# Patient Record
Sex: Male | Born: 1935 | Race: Black or African American | Hispanic: No | Smoking: Former smoker
Health system: Southern US, Community
[De-identification: ages and names within clinical notes are randomized; demographics above are authoritative.]

## PROBLEM LIST (undated history)

## (undated) DIAGNOSIS — N2581 Secondary hyperparathyroidism of renal origin: Secondary | ICD-10-CM

## (undated) DIAGNOSIS — E785 Hyperlipidemia, unspecified: Secondary | ICD-10-CM

## (undated) DIAGNOSIS — I10 Essential (primary) hypertension: Secondary | ICD-10-CM

## (undated) DIAGNOSIS — I441 Atrioventricular block, second degree: Secondary | ICD-10-CM

## (undated) DIAGNOSIS — I2510 Atherosclerotic heart disease of native coronary artery without angina pectoris: Secondary | ICD-10-CM

## (undated) DIAGNOSIS — I509 Heart failure, unspecified: Secondary | ICD-10-CM

## (undated) DIAGNOSIS — J189 Pneumonia, unspecified organism: Secondary | ICD-10-CM

## (undated) DIAGNOSIS — R0602 Shortness of breath: Secondary | ICD-10-CM

## (undated) DIAGNOSIS — I7409 Other arterial embolism and thrombosis of abdominal aorta: Secondary | ICD-10-CM

## (undated) DIAGNOSIS — N186 End stage renal disease: Secondary | ICD-10-CM

## (undated) DIAGNOSIS — Z992 Dependence on renal dialysis: Secondary | ICD-10-CM

## (undated) DIAGNOSIS — I219 Acute myocardial infarction, unspecified: Secondary | ICD-10-CM

## (undated) HISTORY — DX: Secondary hyperparathyroidism of renal origin: N25.81

## (undated) HISTORY — DX: Other arterial embolism and thrombosis of abdominal aorta: I74.09

## (undated) HISTORY — DX: Essential (primary) hypertension: I10

## (undated) HISTORY — PX: DIALYSIS FISTULA CREATION: SHX611

## (undated) HISTORY — DX: Atrioventricular block, second degree: I44.1

## (undated) HISTORY — DX: End stage renal disease: N18.6

## (undated) HISTORY — DX: Atherosclerotic heart disease of native coronary artery without angina pectoris: I25.10

## (undated) HISTORY — DX: Heart failure, unspecified: I50.9

## (undated) HISTORY — DX: Hyperlipidemia, unspecified: E78.5

---

## 1997-06-08 DIAGNOSIS — Z992 Dependence on renal dialysis: Secondary | ICD-10-CM

## 1997-06-08 HISTORY — DX: Dependence on renal dialysis: Z99.2

## 1999-03-20 ENCOUNTER — Encounter: Payer: Self-pay | Admitting: Family Medicine

## 1999-03-20 ENCOUNTER — Ambulatory Visit (HOSPITAL_COMMUNITY): Admission: RE | Admit: 1999-03-20 | Discharge: 1999-03-20 | Payer: Self-pay | Admitting: Family Medicine

## 2004-11-12 ENCOUNTER — Ambulatory Visit: Payer: Self-pay | Admitting: Cardiology

## 2005-04-08 ENCOUNTER — Inpatient Hospital Stay (HOSPITAL_COMMUNITY): Admission: AD | Admit: 2005-04-08 | Discharge: 2005-05-05 | Payer: Self-pay | Admitting: Cardiology

## 2005-04-08 ENCOUNTER — Ambulatory Visit: Payer: Self-pay | Admitting: Cardiology

## 2005-04-08 ENCOUNTER — Ambulatory Visit: Payer: Self-pay | Admitting: Critical Care Medicine

## 2005-04-08 ENCOUNTER — Ambulatory Visit: Payer: Self-pay | Admitting: Physical Medicine & Rehabilitation

## 2005-04-10 ENCOUNTER — Ambulatory Visit: Payer: Self-pay | Admitting: Cardiology

## 2005-04-16 ENCOUNTER — Ambulatory Visit: Payer: Self-pay | Admitting: *Deleted

## 2005-04-18 HISTORY — PX: CORONARY ARTERY BYPASS GRAFT: SHX141

## 2005-05-07 ENCOUNTER — Ambulatory Visit: Payer: Self-pay | Admitting: Cardiology

## 2005-05-27 ENCOUNTER — Ambulatory Visit: Payer: Self-pay | Admitting: Cardiology

## 2005-07-09 ENCOUNTER — Ambulatory Visit: Payer: Self-pay | Admitting: Cardiology

## 2005-09-14 ENCOUNTER — Encounter: Payer: Self-pay | Admitting: Cardiology

## 2005-09-15 ENCOUNTER — Ambulatory Visit: Payer: Self-pay | Admitting: Cardiology

## 2005-11-16 ENCOUNTER — Ambulatory Visit: Payer: Self-pay | Admitting: Cardiology

## 2005-11-17 ENCOUNTER — Inpatient Hospital Stay (HOSPITAL_COMMUNITY): Admission: AD | Admit: 2005-11-17 | Discharge: 2005-11-19 | Payer: Self-pay | Admitting: Internal Medicine

## 2005-11-17 ENCOUNTER — Ambulatory Visit: Payer: Self-pay | Admitting: Cardiology

## 2005-11-18 HISTORY — PX: OTHER SURGICAL HISTORY: SHX169

## 2005-12-03 ENCOUNTER — Ambulatory Visit: Payer: Self-pay | Admitting: Cardiology

## 2006-02-25 ENCOUNTER — Ambulatory Visit: Payer: Self-pay | Admitting: Cardiology

## 2006-07-03 IMAGING — CR DG CHEST 1V PORT
1 series · 1 of 1 positions shown · non-contrast
Comparison: 05/01/05.

CLINICAL DATA: History of chest pain.  Follow up chest tube removal.
 PORTABLE CHEST ? 1 VIEW ? 4590 HOURS:

[view not recorded]
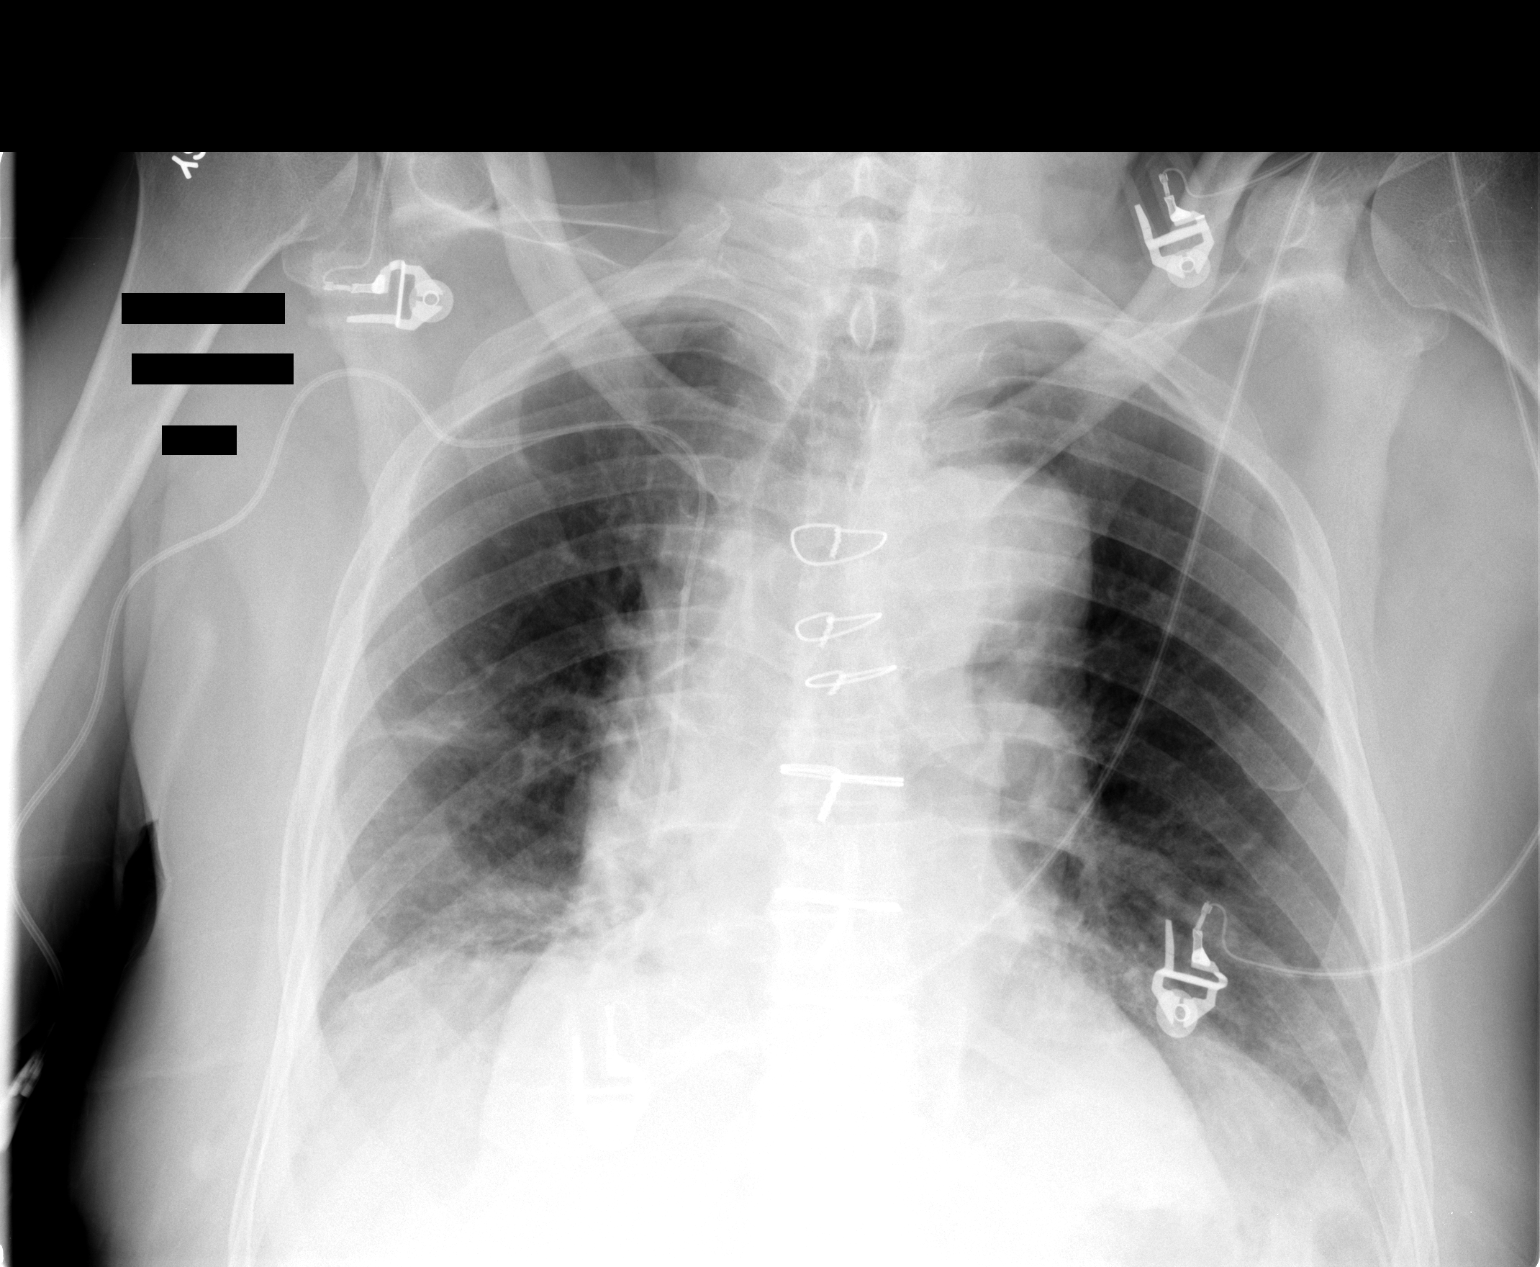

[1 of 1 positions shown; findings below may reference images not displayed]

FINDINGS: A right chest tube has been removed.  There is mild atelectatic change at the right base.  I see no significant pneumothorax.  Central venous catheter tip remains at the SVC/RA junction.  Cardiomegaly again noted.
IMPRESSION: Mild right base atelectasis, no pneumothorax post chest tube removal.

## 2006-07-04 IMAGING — CR DG CHEST 1V PORT
1 series · 1 of 1 positions shown · non-contrast
Comparison: 05/01/05

CLINICAL DATA: CABG
 PORTABLE CHEST- 1 VIEW:

[view not recorded]
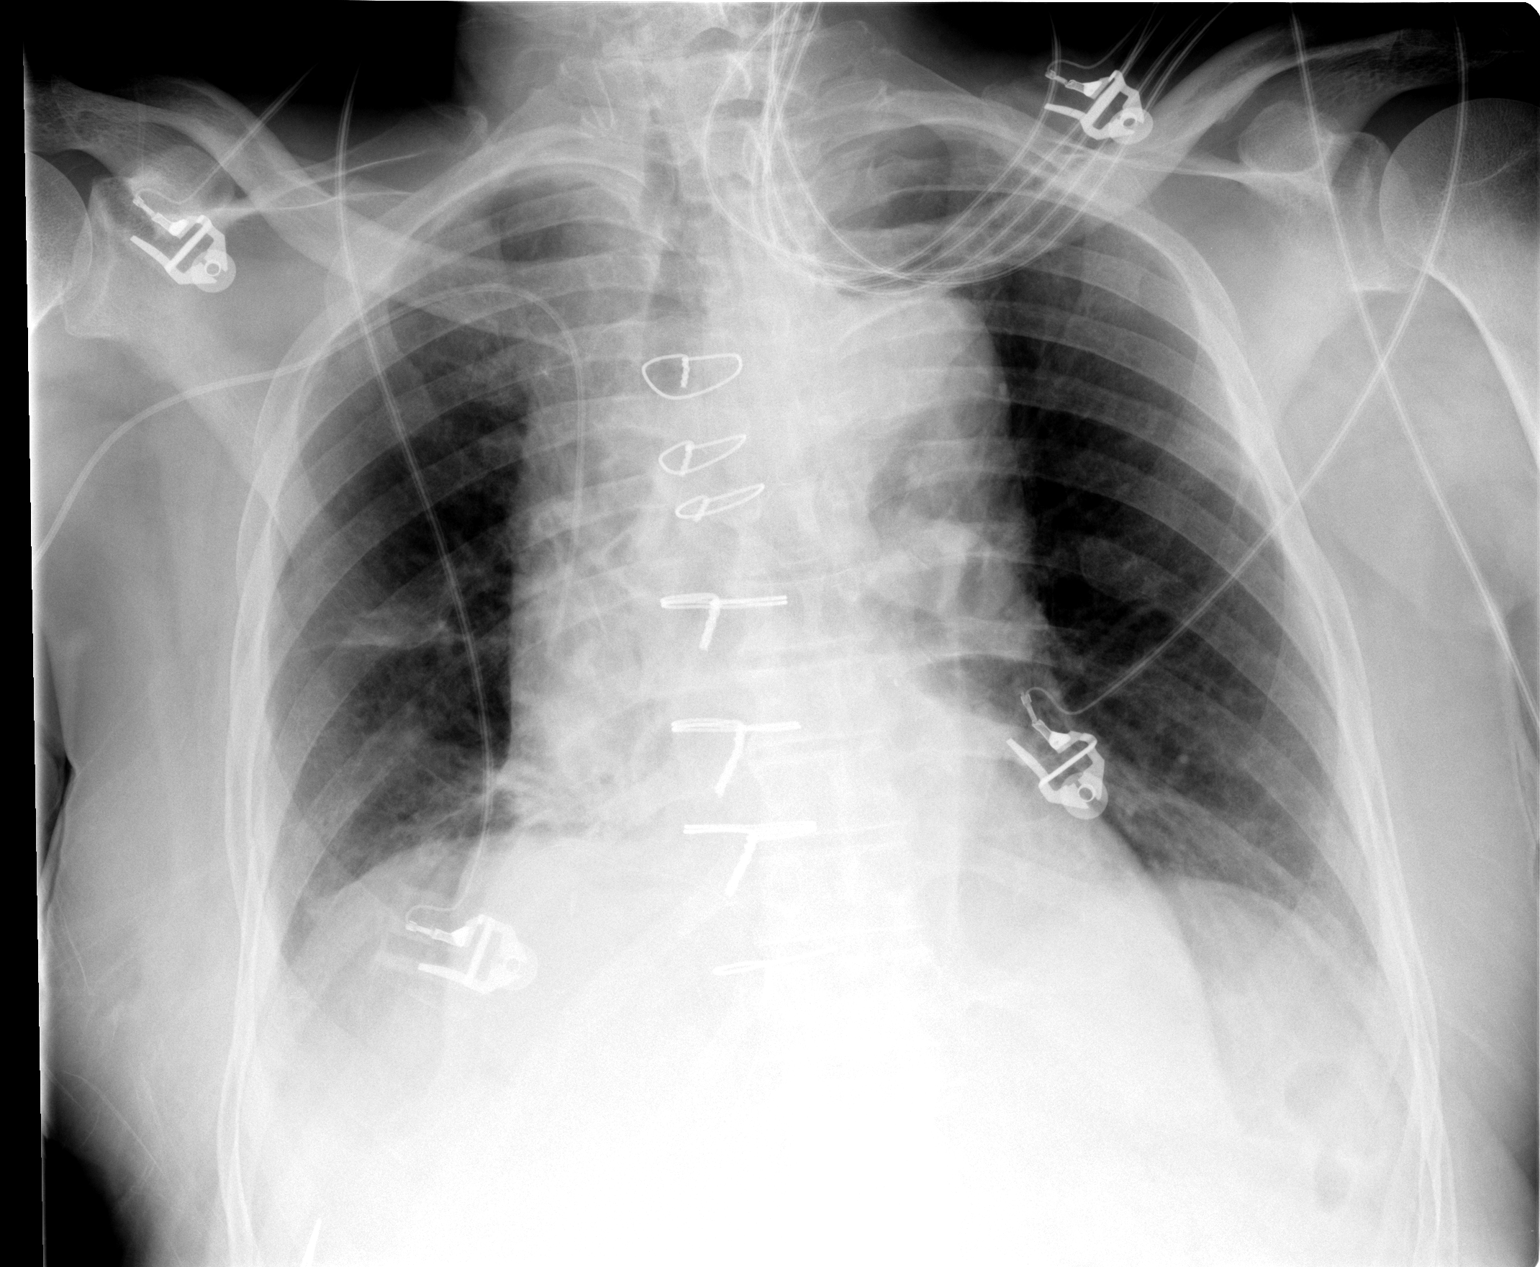

[1 of 1 positions shown; findings below may reference images not displayed]

FINDINGS: Cardiomegaly, cardiac surgery, right PICC line, bilateral lower lung atelectasis are stable.  There is no evidence of pneumothorax.  No significant change since prior study noted.
IMPRESSION: Stable chest.

## 2006-07-07 IMAGING — CR DG HIP COMPLETE 2+V*R*
3 series · 3 of 3 positions shown · non-contrast
Comparison: none

CLINICAL DATA: Chronic hip pain. No acute injury.
 RIGHT HIP ? 3 VIEW:

[t pelvis a.p.]
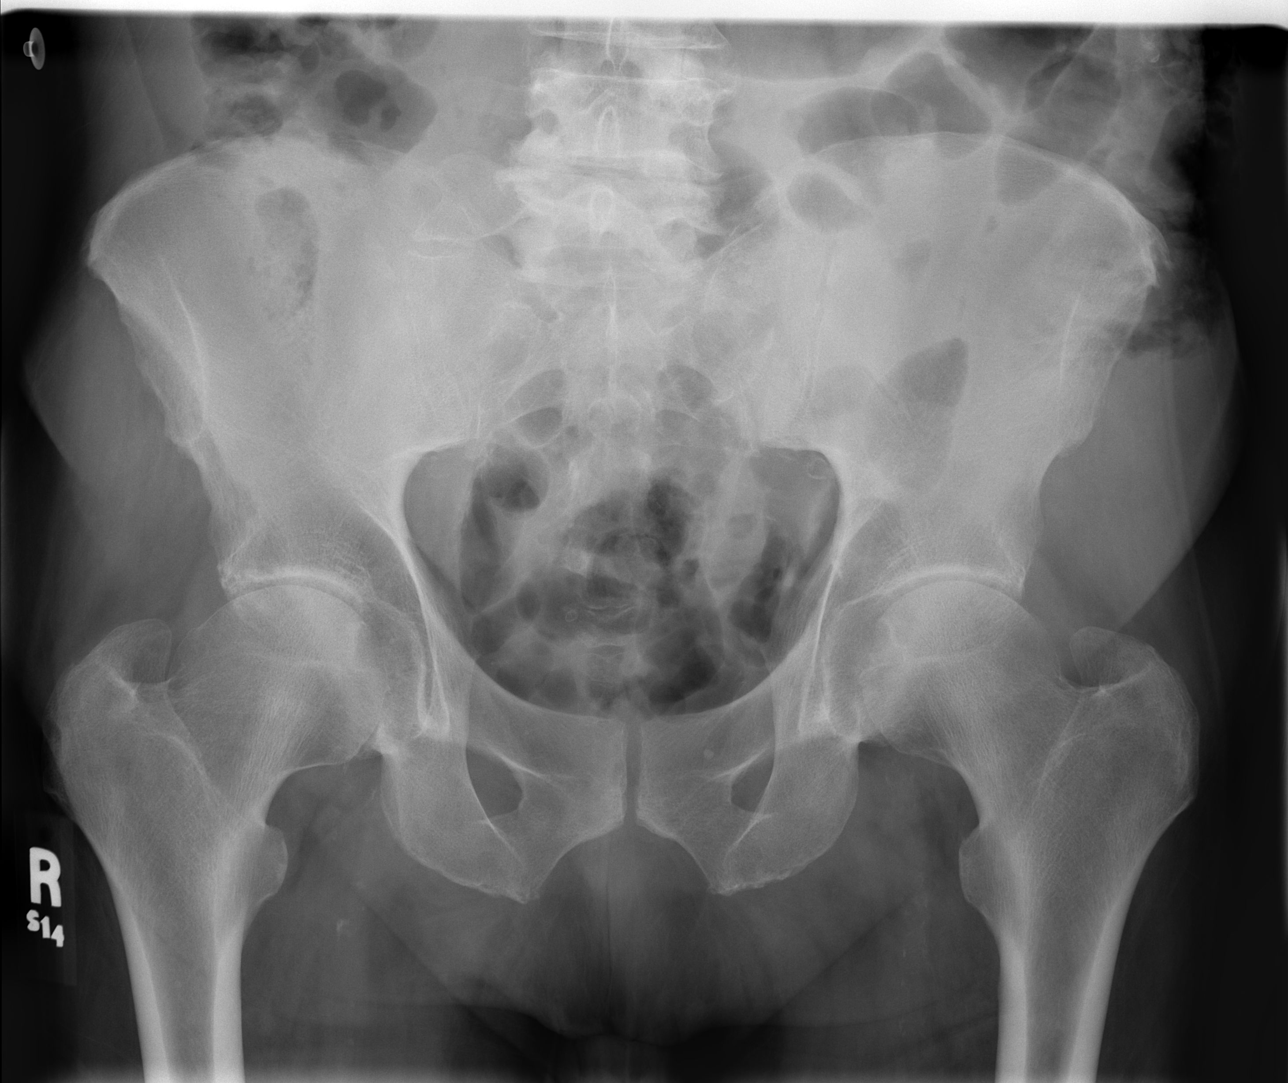

[t hip ap right]
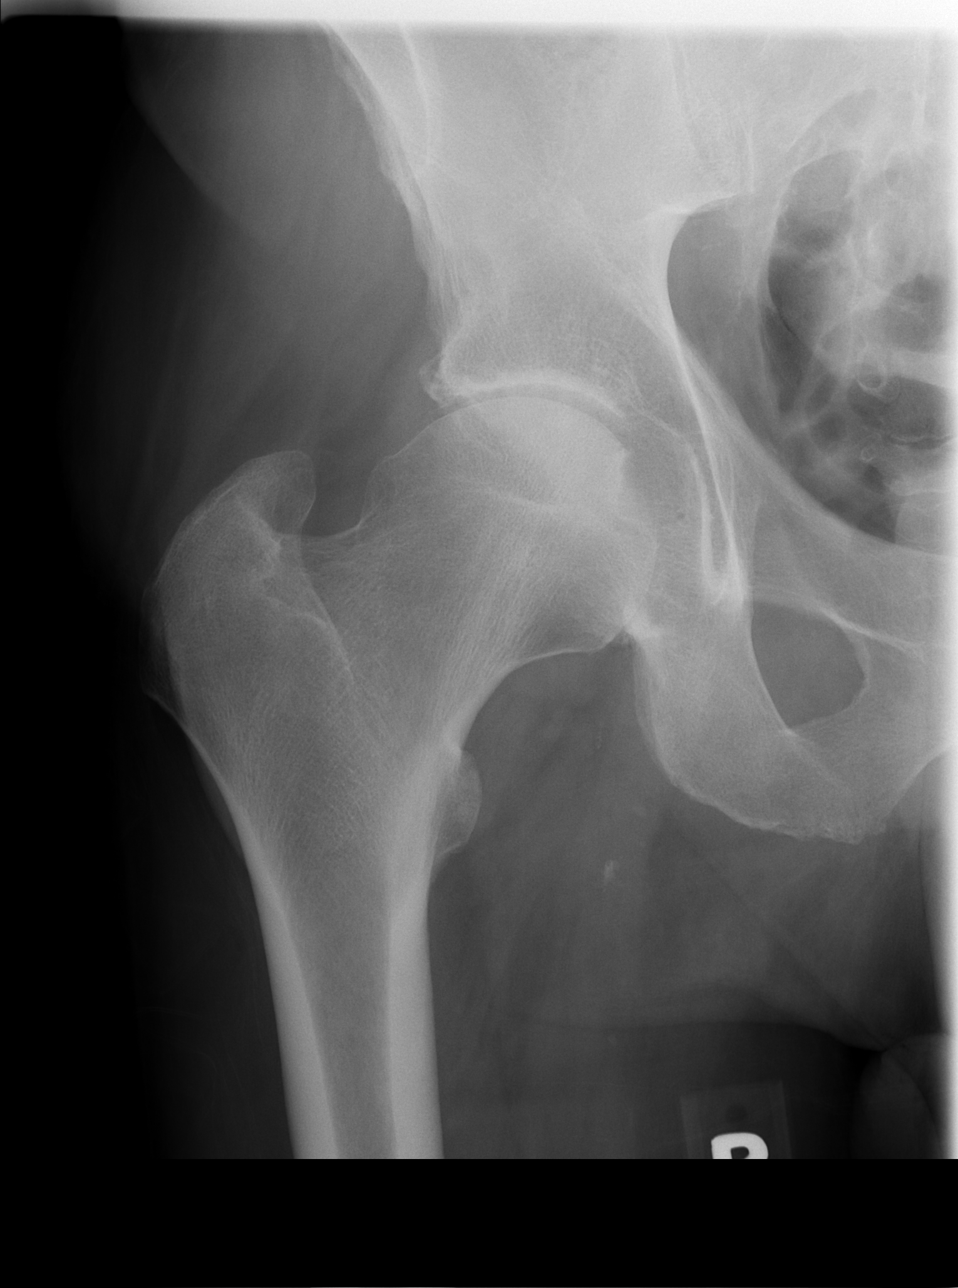

[t hip frog leg right]
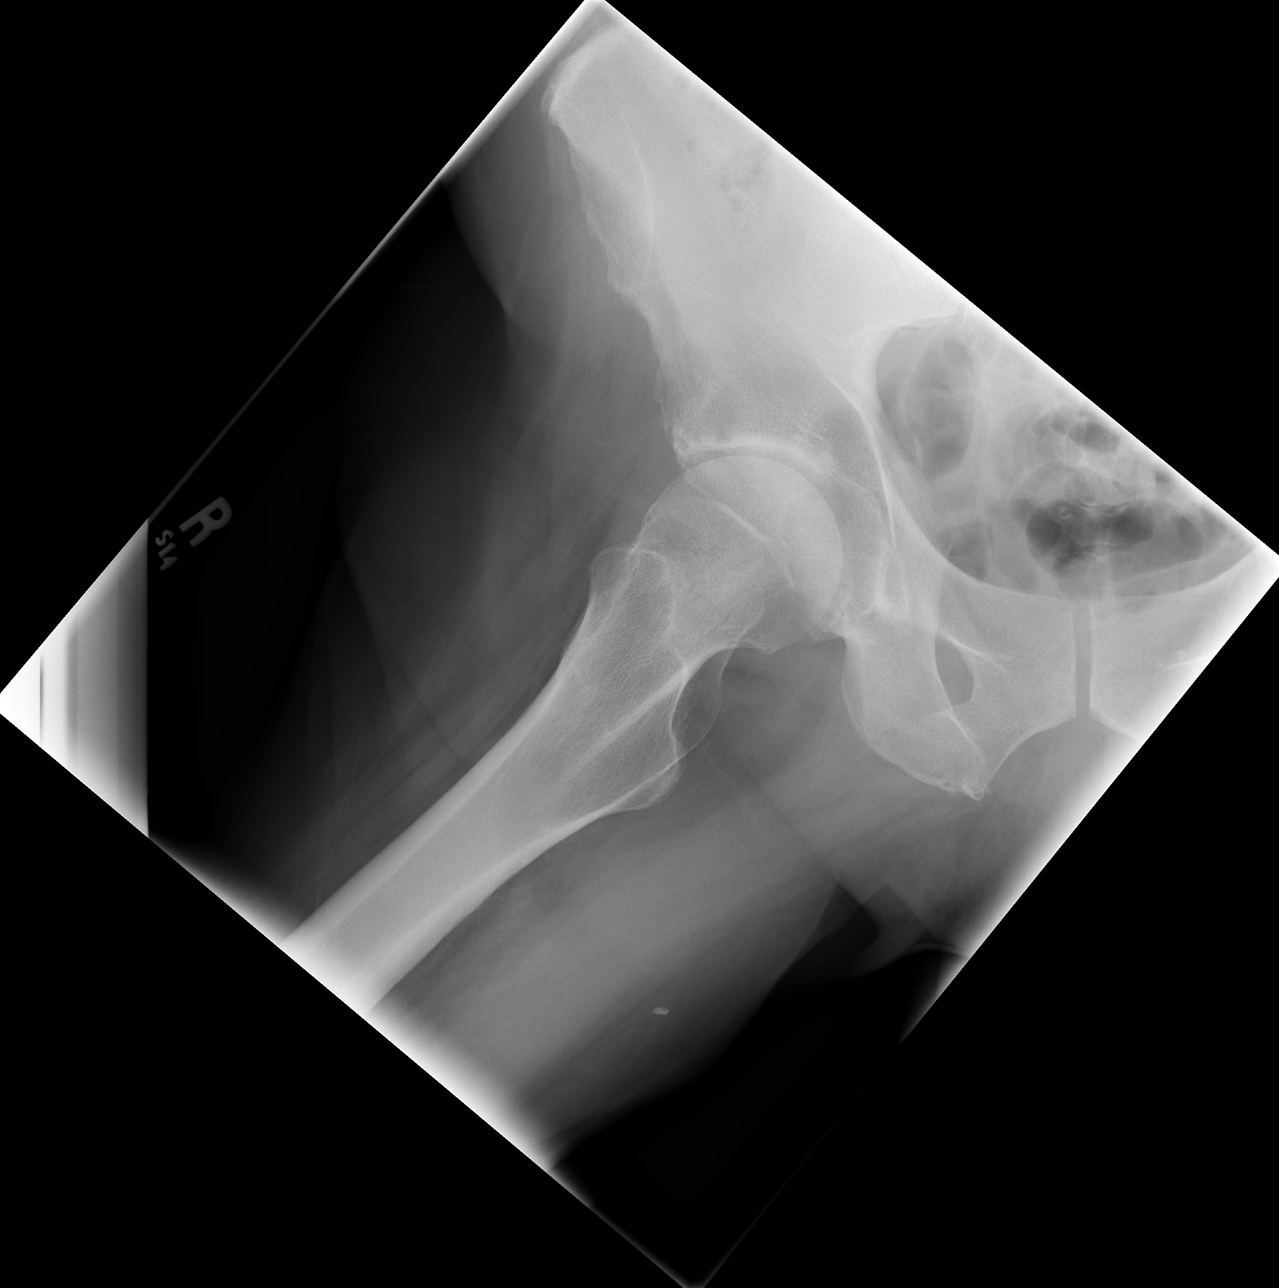

[3 of 3 positions shown; findings below may reference images not displayed]

FINDINGS: There is moderate to marked degenerative disk disease affecting the right hip with subchondral sclerosis and joint space narrowing.
 Mild to moderate degenerative disk disease affects the left hip.
 No acute radiographic abnormalities are noted.  Specifically, I see no fracture or dislocation.
IMPRESSION: 1.  No acute findings.
 2.  Moderate to marked degenerative joint disease affecting the right hip.

## 2007-03-03 ENCOUNTER — Ambulatory Visit: Payer: Self-pay | Admitting: Cardiology

## 2007-04-12 ENCOUNTER — Ambulatory Visit: Payer: Self-pay | Admitting: Internal Medicine

## 2007-08-22 ENCOUNTER — Ambulatory Visit: Payer: Self-pay | Admitting: Internal Medicine

## 2007-12-23 ENCOUNTER — Ambulatory Visit: Payer: Self-pay | Admitting: Internal Medicine

## 2008-04-02 ENCOUNTER — Ambulatory Visit: Payer: Self-pay | Admitting: Internal Medicine

## 2008-06-25 ENCOUNTER — Ambulatory Visit: Payer: Self-pay | Admitting: Internal Medicine

## 2008-09-21 ENCOUNTER — Encounter (INDEPENDENT_AMBULATORY_CARE_PROVIDER_SITE_OTHER): Payer: Self-pay

## 2008-10-04 ENCOUNTER — Encounter: Payer: Self-pay | Admitting: Internal Medicine

## 2008-10-09 ENCOUNTER — Telehealth (INDEPENDENT_AMBULATORY_CARE_PROVIDER_SITE_OTHER): Payer: Self-pay | Admitting: *Deleted

## 2008-11-12 ENCOUNTER — Ambulatory Visit: Payer: Self-pay | Admitting: Internal Medicine

## 2008-11-21 ENCOUNTER — Encounter: Payer: Self-pay | Admitting: Internal Medicine

## 2009-01-29 DIAGNOSIS — E785 Hyperlipidemia, unspecified: Secondary | ICD-10-CM

## 2009-01-29 DIAGNOSIS — I509 Heart failure, unspecified: Secondary | ICD-10-CM | POA: Insufficient documentation

## 2009-01-29 DIAGNOSIS — I10 Essential (primary) hypertension: Secondary | ICD-10-CM | POA: Insufficient documentation

## 2009-02-01 ENCOUNTER — Ambulatory Visit: Payer: Self-pay | Admitting: Internal Medicine

## 2009-02-01 DIAGNOSIS — I359 Nonrheumatic aortic valve disorder, unspecified: Secondary | ICD-10-CM | POA: Insufficient documentation

## 2009-05-16 ENCOUNTER — Encounter: Payer: Self-pay | Admitting: Internal Medicine

## 2009-06-03 ENCOUNTER — Encounter: Payer: Self-pay | Admitting: Internal Medicine

## 2009-07-04 ENCOUNTER — Encounter: Payer: Self-pay | Admitting: Internal Medicine

## 2009-07-14 ENCOUNTER — Encounter: Payer: Self-pay | Admitting: Internal Medicine

## 2009-07-15 ENCOUNTER — Ambulatory Visit: Payer: Self-pay | Admitting: Internal Medicine

## 2009-08-02 ENCOUNTER — Encounter: Payer: Self-pay | Admitting: Internal Medicine

## 2009-10-28 ENCOUNTER — Encounter: Payer: Self-pay | Admitting: Internal Medicine

## 2009-11-13 ENCOUNTER — Ambulatory Visit: Payer: Self-pay | Admitting: Internal Medicine

## 2009-12-19 ENCOUNTER — Encounter: Payer: Self-pay | Admitting: Internal Medicine

## 2010-01-28 ENCOUNTER — Telehealth (INDEPENDENT_AMBULATORY_CARE_PROVIDER_SITE_OTHER): Payer: Self-pay | Admitting: *Deleted

## 2010-02-07 ENCOUNTER — Ambulatory Visit: Payer: Self-pay | Admitting: Internal Medicine

## 2010-05-15 ENCOUNTER — Ambulatory Visit: Payer: Self-pay | Admitting: Internal Medicine

## 2010-05-21 ENCOUNTER — Encounter: Payer: Self-pay | Admitting: Internal Medicine

## 2010-05-24 ENCOUNTER — Encounter: Payer: Self-pay | Admitting: Internal Medicine

## 2010-06-25 ENCOUNTER — Ambulatory Visit: Admit: 2010-06-25 | Payer: Self-pay | Admitting: Internal Medicine

## 2010-06-30 ENCOUNTER — Encounter: Payer: Self-pay | Admitting: Internal Medicine

## 2010-06-30 ENCOUNTER — Ambulatory Visit
Admission: RE | Admit: 2010-06-30 | Discharge: 2010-06-30 | Payer: Self-pay | Source: Home / Self Care | Attending: Internal Medicine | Admitting: Internal Medicine

## 2010-07-10 NOTE — Cardiovascular Report (Signed)
Summary: Office Visit Remote   Office Visit Remote   Imported By: Roderic Ovens 08/02/2009 13:54:55  _____________________________________________________________________  External Attachment:    Type:   Image     Comment:   External Document

## 2010-07-10 NOTE — Letter (Signed)
Summary: Device-Delinquent Phone Journalist, newspaper, Main Office  [address]   [place], [place] [address]   Phone: [phone]  Fax: [phone]     May 21, 2010 MRN: [id number]   [facility] [address] [place], [place]  [address]   Dear Jonathan Padilla,  According to our records, you were scheduled for a device phone transmission on 05-15-2010.     We did not receive any results from this check.  If you transmitted on your scheduled day, please call us to help troubleshoot your system.  If you forgot to send your transmission, please send one upon receipt of this letter.  Thank you,   Architectural technologist Device Clinic

## 2010-07-10 NOTE — Cardiovascular Report (Signed)
Summary: Office Visit Remote   Office Visit Remote   Imported By: Roderic Ovens 12/23/2009 12:21:19  _____________________________________________________________________  External Attachment:    Type:   Image     Comment:   External Document

## 2010-07-10 NOTE — Letter (Signed)
Summary: Device-Delinquent Check  [facility], Main Office  [address]   [place], [place] [address]   Phone: [phone]  Fax: [phone]     July 04, 2009 MRN: [id number]   [facility] [address] [place], [place]  [address]   Dear Mr. Halley,  According to our records, you have not had your implanted device checked in the recommended period of time.  We are unable to determine appropriate device function without checking your device on a regular basis.  Please call our office to schedule an appointment as soon as possible.  If you are having your device checked by another physician, please call us so that we may update our records.  Thank you,   Architectural technologist Device Clinic  Sent via certified mail/AS

## 2010-07-10 NOTE — Cardiovascular Report (Signed)
Summary: Cardiac Catheterization  Cardiac Catheterization   Imported By: Dorise Hiss 02/04/2010 09:24:20  _____________________________________________________________________  External Attachment:    Type:   Image     Comment:   External Document

## 2010-07-10 NOTE — Letter (Signed)
Summary: Remote Device Check  [facility], Main Office  [address]   [place], [place] [address]   Phone: [phone]  Fax: [phone]     December 19, 2009 MRN: [id number]   [facility] [address] [place], [place]  [address]   Dear Mr. Roswell,   Your remote transmission was recieved and reviewed by your physician.  All diagnostics were within normal limits for you.  __X____Your next office visit is scheduled for:  August w/Dr Graciela Husbands. Please call our office to schedule an appointment.    Sincerely,  Vella Kohler

## 2010-07-10 NOTE — Progress Notes (Signed)
Summary: PHONE: APPT   Phone Note Call from Patient Call back at Home Phone [phone]   Caller: Patient Summary of Call: Patient has appt with dr. Johney Frame on 02-07-2010 He takes dialysis on M.W,F so therefore he states that he can not come here on these days.  Who needs to get this message? Initial call taken by: Zachary George,  January 28, 2010 2:59 PM  Follow-up for Phone Call        Pt's 1030 appt r/s to 11am. He will be here as close to 11 as he can. Follow-up by: Cyril Loosen, RN, BSN,  January 28, 2010 4:00 PM

## 2010-07-10 NOTE — Cardiovascular Report (Signed)
Summary: Office Visit   Office Visit   Imported By: Roderic Ovens 02/18/2010 11:53:01  _____________________________________________________________________  External Attachment:    Type:   Image     Comment:   External Document

## 2010-07-10 NOTE — Letter (Signed)
Summary: Device-Delinquent Phone Journalist, newspaper, Main Office  [address]   [place], [place] [address]   Phone: [phone]  Fax: [phone]     Oct 28, 2009 MRN: [id number]   [facility] [address] [place], [place]  [address]   Dear Jonathan Padilla,  According to our records, you were scheduled for a device phone transmission on   10-16-09.     We did not receive any results from this check.  If you transmitted on your scheduled day, please call us to help troubleshoot your system.  If you forgot to send your transmission, please send one upon receipt of this letter.  Thank you,   Architectural technologist Device Clinic

## 2010-07-10 NOTE — Letter (Signed)
Summary: Remote Device Check  [facility], Main Office  [address]   [place], [place] [address]   Phone: [phone]  Fax: [phone]     August 02, 2009 MRN: [id number]   [facility] [address] [place], [place]  [address]   Dear Mr. Azizi,   Your remote transmission was recieved and reviewed by your physician.  All diagnostics were within normal limits for you.  __X___Your next transmission is scheduled for:   Oct 16, 2009.  Please transmit at any time this day.  If you have a wireless device your transmission will be sent automatically.      Sincerely,  Proofreader

## 2010-07-10 NOTE — Assessment & Plan Note (Signed)
Summary: PACER CHECK-PT MAY BE A LITTLE LATE D/T DIALYSIS-JM   Current Medications (verified): 1)  Lipitor 40 Mg Tabs (Atorvastatin Calcium) .... Take One Tablet By Mouth Daily. 2)  Protonix 20 Mg Tbec (Pantoprazole Sodium) .... Take 1 Tablet By Mouth Once Daily 3)  Renagel 800 Mg Tabs (Sevelamer Hcl) .... Four Times A Day 4)  Cozaar 100 Mg Tabs (Losartan Potassium) .... Take One Tablet By Mouth Daily 5)  Carvedilol 6.25 Mg Tabs (Carvedilol) .... Take One Tablet By Mouth Twice A Day 6)  Isosorbide Mononitrate Cr 30 Mg Xr24h-Tab (Isosorbide Mononitrate) .... Take One Tablet By Mouth Daily 7)  Amlodipine Besylate 10 Mg Tabs (Amlodipine Besylate) .... Take One Tablet By Mouth Daily 8)  Rena-Vite  Tabs (B Complex-C-Folic Acid) .... Take 1 Tablet By Mouth Once Daily  Allergies (verified): No Known Drug Allergies   PPM Specifications Following MD:  Sherryl Manges, MD     PPM Vendor:  Medtronic     PPM Model Number:  [id number]     PPM Serial Number:  [id number] PPM DOI:  11/18/2005     PPM Implanting MD:  Sherryl Manges, MD  Lead 1    Location: RA     DOI: 11/18/2005     Model #: [id number]     Serial #: [id number]     Status: active Lead 2    Location: RV     DOI: 11/18/2005     Model #: [id number]     Serial #: [id number]     Status: active   Indications:  CHB   PPM Follow Up Battery Voltage:  3.00 V     Pacer Dependent:  No       PPM Device Measurements Atrium  Amplitude: 2.1 mV, Impedance: 496 ohms, Threshold: 0.5 V at 0.4 msec Right Ventricle  Amplitude: 5.1 mV, Impedance: 480 ohms, Threshold: 1.0 V at 0.4 msec  Episodes MS Episodes:  0     Coumadin:  No Ventricular High Rate:  0     Atrial Pacing:  5.8%     Ventricular Pacing:  15.6  Parameters Mode:  DDDR     Lower Rate Limit:  60     Upper Rate Limit:  130 Paced AV Delay:  350     Sensed AV Delay:  350 Next Remote Date:  05/15/2010     Next Cardiology Appt Due:  08/07/2010 Tech Comments:  NORMAL DEVICE FUNCTION.  NO EPISODES SINCE LAST  CHECK.  NO CHANGES MADE. CARELINK CHECK 05-15-10. ROV IN 6 MTHS W/JA IN [place]. Vella Kohler  February 07, 2010 10:40 AM

## 2010-07-10 NOTE — Cardiovascular Report (Signed)
Summary: Office Visit Remote   Office Visit Remote   Imported By: Roderic Ovens 06/17/2010 13:41:18  _____________________________________________________________________  External Attachment:    Type:   Image     Comment:   External Document

## 2010-07-10 NOTE — Assessment & Plan Note (Signed)
Summary: followup from carelink/vt episode   Visit Type:  Pacemaker check Primary Provider:  Dr Christell Constant   History of Present Illness: The patient presents today for routine electrophysiology followup. He reports doing very well since last being seen in our clinic.  He is chronically on hemodialysis.  The patient denies symptoms of palpitations, chest pain, shortness of breath, orthopnea, PND, , dizziness, presyncope, syncope, or neurologic sequela.  His edema is stable and managed with dialysis.  The patient is tolerating medications without difficulties and is otherwise without complaint today.   Preventive Screening-Counseling & Management  Alcohol-Tobacco     Smoking Status: quit  Current Medications (verified): 1)  Lipitor 40 Mg Tabs (Atorvastatin Calcium) .... Take One Tablet By Mouth Daily. 2)  Protonix 20 Mg Tbec (Pantoprazole Sodium) .... Take 1 Tablet By Mouth Once Daily 3)  Renagel 800 Mg Tabs (Sevelamer Hcl) .... Four Times A Day 4)  Cozaar 100 Mg Tabs (Losartan Potassium) .... Take One Tablet By Mouth Daily 5)  Carvedilol 6.25 Mg Tabs (Carvedilol) .... Take One Tablet By Mouth Twice A Day 6)  Isosorbide Mononitrate Cr 30 Mg Xr24h-Tab (Isosorbide Mononitrate) .... Take One Tablet By Mouth Daily 7)  Amlodipine Besylate 10 Mg Tabs (Amlodipine Besylate) .... Take One Tablet By Mouth Daily 8)  Rena-Vite  Tabs (B Complex-C-Folic Acid) .... Take 1 Tablet By Mouth Once Daily  Allergies (verified): No Known Drug Allergies  Comments:  Nurse/Medical Assistant: The patient's medications and allergies were reviewed with the patient and were updated in the Medication and Allergy Lists. Tammi Romine CMA (June 30, 2010 2:58 PM)  Past History:  Past Medical History: HYPERLIPIDEMIA-MIXED (ICD-272.4) HYPERTENSION, UNSPECIFIED (ICD-401.9) CONGESTIVE HEART FAILURE, UNSPECIFIED (ICD-428.0) CAD, ARTERY BYPASS GRAFT (ICD-414.04) AV BLOCK, COMPLETE (intermittent) s/p PPM End-stage renal  disease on HD Aortoiliac occlusive disease Secondary hyperparathyroidism Gout.  Past Surgical History: Reviewed history from 01/29/2009 and no changes required. CABG - 04/18/05 - Sheliah Plane, MD PCM - 11/18/05 - Medtronic EnRhythm W1191 - complete heart block  Social History: Reviewed history from 01/29/2009 and no changes required. Retired  Married  Tobacco Use - Former.   Review of Systems       All systems are reviewed and negative except as listed in the HPI.   Vital Signs:  Patient profile:   77 year old male Height:      76 inches Weight:      173 pounds BMI:     21.13 Pulse rate:   75 / minute  Serial Vital Signs/Assessments:  Comments: TOOK BP IN R LOWER LEG: 78/48 By: Fuller Plan CMA    Physical Exam  General:  elderly male, NAD Head:  normocephalic and atraumatic Eyes:  PERRLA/EOM intact; conjunctiva and lids normal. Mouth:  Teeth, gums and palate normal. Oral mucosa normal. Neck:  supple Chest Wall:  R sided PPM pocket is well healed Lungs:  clear Heart:  RRR, 2/6 diastolic murmur LSB Abdomen:  Bowel sounds positive; abdomen soft and non-tender without masses, organomegaly, or hernias noted. No hepatosplenomegaly. Msk:  Back normal, normal gait. Muscle strength and tone normal. Extremities:  R arm AV graft in place L arm AV fistula with multiple pseudoaneurysms Neurologic:  Alert and oriented x 3. Skin:  Intact without lesions or rashes. Psych:  Normal affect.   PPM Specifications Following MD:  Sherryl Manges, MD     PPM Vendor:  Medtronic     PPM Model Number:  [id number]     PPM Serial Number:  [id number]  PPM DOI:  11/18/2005     PPM Implanting MD:  Sherryl Manges, MD  Lead 1    Location: RA     DOI: 11/18/2005     Model #: 0454     Serial #: [id number]     Status: active Lead 2    Location: RV     DOI: 11/18/2005     Model #: 2956     Serial #: [id number]     Status: active   Indications:  CHB   PPM Follow Up Battery Voltage:  2.99 V      Pacer Dependent:  No       PPM Device Measurements Atrium  Amplitude: 2.1 mV, Impedance: 400 ohms, Threshold: 0.5 V at 0.4 msec Right Ventricle  Amplitude: 11.3 mV, Impedance: 480 ohms, Threshold: 1.0 V at 0.4 msec  Episodes MS Episodes:  0     Coumadin:  No Ventricular High Rate:  4     Atrial Pacing:  5.2%     Ventricular Pacing:  32.4%  Parameters Mode:  DDDR     Lower Rate Limit:  60     Upper Rate Limit:  130 Paced AV Delay:  350     Sensed AV Delay:  350 Next Remote Date:  10/02/2010     Next Cardiology Appt Due:  06/09/2011 Tech Comments:  4 NST EPISODES--LONGEST WAS 6 BEATS. NORMAL DEVICE FUNCTION.  NO CHANGES MADE. CARELINK 10-02-10 AND ROV IN 12 MTHS W/JA IN [place] [facility]. Vella Kohler  June 30, 2010 3:04 PM MD Comments:  agree  Impression & Recommendations:  Problem # 1:  AV BLOCK, COMPLETE INTERMITTENT (ICD-426.0) normal ppm function as above  Problem # 2:  CAD, ARTERY BYPASS GRAFT EF NL (ICD-414.04) no symptoms of ischemia he has been taken off of ASA due to bleeding  Problem # 3:  HYPERTENSION, UNSPECIFIED (ICD-401.9) stable  Problem # 4:  HYPERLIPIDEMIA-MIXED (ICD-272.4) stable His updated medication list for this problem includes:    Lipitor 40 Mg Tabs (Atorvastatin calcium) .Marland Kitchen... Take one tablet by mouth daily.

## 2010-07-16 NOTE — Cardiovascular Report (Signed)
Summary: Card Device Clinic/ INTERROGATION QUICK LOOK  Card Device Clinic/ INTERROGATION QUICK LOOK   Imported By: Dorise Hiss 07/10/2010 16:44:41  _____________________________________________________________________  External Attachment:    Type:   Image     Comment:   External Document

## 2010-10-02 ENCOUNTER — Encounter: Payer: Self-pay | Admitting: *Deleted

## 2010-10-05 ENCOUNTER — Encounter: Payer: Self-pay | Admitting: *Deleted

## 2010-10-21 NOTE — Cardiovascular Report (Signed)
[facility]                   [facility] NOTE   NAME:Honsinger, Rebecka Apley                       MRN:          [id number]  DATE:03/03/2007                            DOB:          [birth year]    Mr. Kreitzer was seen today in the [facility] for followup of his  Medtronic InRhythm pacemaker, model #[id number].  His device was implanted  on November 18, 2005, for complete heart block.   Interrogation of his device demonstrates,  P waves of 2.1 millivolts with an atrial impedance of 552 ohms and a  threshold of 1 volt at 0.4 milliseconds.  His R waves measured 11 millivolts with an RV impedance of 512 ohms and  a threshold of 0.5 volts at 0.4 milliseconds.  His battery voltage was 3 volts.  He was in sinus bradycardia today.  He had no mode switch or ventricular heart rate episodes since last  interrogation.  He is programmed DDDR with lower of 60 and upper rate 130, with a paced  AV of 250 milliseconds and a sensed AV of 220 msec.  He is currently A pacing 4.5% of the time, V pacing 98.7% of the time.  Today, I changed his AV delay to a paced AV of 300 milliseconds with a  sensed AV of 280 milliseconds to allow for intrinsic induction.   He will send [facility] transmissions and return to the clinic in 1 year.      Gypsy Balsam, RN,BSN  Electronically Signed      Duke Salvia, MD, [facility]  Electronically Signed   AS/MedQ  DD: 03/03/2007  DT: 03/03/2007  Job #: [id number]

## 2010-10-24 NOTE — Op Note (Signed)
NAMEKAID, SEEBERGER NO.:  [id number]   MEDICAL RECORD NO.:  [id number]          PATIENT TYPE:  INP   LOCATION:  2310                         FACILITY:  [facility]   PHYSICIAN:  Shan Levans, M.D. LHCDATE OF BIRTH:  05-31-1928   DATE OF PROCEDURE:  04/17/2005  DATE OF DISCHARGE:                                 OPERATIVE REPORT   PROCEDURE PERFORMED:  Bronchoscopy.   PULMONOLOGIST:  Shan Levans, M.D.   ANESTHESIA:  One percent Xylocaine local.   PREPROCEDURE MEDICATION:   INDICATIONS:  Evaluate for mucous plug.   Doctor cancelled dictation at this point.      Shan Levans, M.D. [facility]  Electronically Signed     PW/MEDQ  D:  04/17/2005  T:  04/18/2005  Job:  [id number]   cc:   Kerin Perna, M.D.  [address]  [place]  [place] [address]

## 2010-10-24 NOTE — Discharge Summary (Signed)
Jonathan, Padilla NO.:  [id number]   MEDICAL RECORD NO.:  [id number]          PATIENT TYPE:  INP   LOCATION:  2901                         FACILITY:  [facility]   PHYSICIAN:  Arvilla Meres, M.D. LHCDATE OF BIRTH:  [birth year]   DATE OF ADMISSION:  11/17/2005  DATE OF DISCHARGE:  11/18/2005                                 DISCHARGE SUMMARY   .   Dictation stopped here.      Maple Mirza, P.A.      Arvilla Meres, M.D. [facility]  Electronically Signed    GM/MEDQ  D:  11/18/2005  T:  11/18/2005  Job:  [id number]   cc:   Paulita Cradle, NP  [facility]   Duke Salvia, M.D.  [address]  [address]  [place]  [place] [address]   Learta Codding, M.D. [facility]  [address]  [address]  [place]  [place] [address]   Hemodialysis Center in [place] [phone]

## 2010-10-24 NOTE — Cardiovascular Report (Signed)
NAMEELIS, SAUBER NO.:  [id number]   MEDICAL RECORD NO.:  [id number]          PATIENT TYPE:  INP   LOCATION:  4743                         FACILITY:  [facility]   PHYSICIAN:  Arvilla Meres, M.D. LHCDATE OF BIRTH:  12/23/1927   DATE OF PROCEDURE:  04/09/2005  DATE OF DISCHARGE:                              CARDIAC CATHETERIZATION   PATIENT IDENTIFICATION:  Jonathan Padilla is a 77 year old male with a history of  a peripheral vascular disease and end-stage renal disease, been on dialysis  for several years.  Was recently admitted with congestive heart failure and  found to have a mildly reduced LV function.  Also had some a atypical chest  pain which resolved with a GI cocktail.  Troponin was mildly positive at  0.47 and he is thus referred for cardiac catheterization.   PROCEDURES PERFORMED:  1.  Selective coronary angiography.  2.  Left heart catheterization.  3.  Left ventriculogram.  4.  Left subclavian angiography.   DESCRIPTION OF PROCEDURE:  The risks and benefits of catheterization were  explained to Mr. Herder.  Consent was signed and placed on the chart.  A 6-  French sheath was placed in the right femoral artery.  Standard JL5, JR4,  and angled pigtail were used for the procedure.  There were no apparent  complications.  All catheter exchanges were made over a wire.   Central aortic pressure is 169/92 with a mean of 123.  LV pressure is 196/15  with an EDP of 15.  There was no gradient on aortic valve pullback.   Left main had a 30% distal lesion.   LAD was a long vessel wrapping the apex.  It gave off a tiny first diagonal  and a large second diagonal.  There was a 50-60% concentric plaque in the  ostium of the LAD.  In the mid LAD there was heavy disease.  There was a  long 70-80% lesion culminating in a 90% stenosis prior to the takeoff the  second diagonal.  After the second diagonal there was a long 50% lesion in  the mid section.  The distal  LAD was large and appeared suitable for a  graft.   Left circumflex was a large ectatic vessel.  It gave off a tiny ramus, a  small branching OM1, a tiny OM2, and a moderate OM3.  Circumflex was also  heavily diseased.  There was a 60% ostial lesion followed by an 80% proximal  lesion and 60% tubular mid lesion.  In the proximal portion of the OM1 there  was a 99% stenosis followed by a 60% stenosis in the body.  In the OM3 there  was a 99% stenosis.   The right coronary artery was heavily calcified proximally.  There was an 80-  90% stenosis proximally.  In the mid section there was an 80% stenosis.  Distally around the bend there was 30% tubular stenosis followed by a 40%  stenosis prior to the takeoff of the PDA.  PDA was a small to moderate-sized  vessel.  There was two small PLs.   Left  ventriculogram done in the RAO approach showed an EF of 45-50% with no  significant mitral regurgitation.  There was a mild anterior wall  hypokinesis.   Left subclavian angiography showed a patent subclavian with a patent LIMA to  the chest wall.  There was a 95% stenosis in the ostium of the vertebral  branch.   ASSESSMENT:  1.  Severe three vessel coronary artery disease.  2.  Mild decreased in left ventricular function with elevated filling      pressures.  3.  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  4.  Patent subclavian with no significant obstruction.  The left internal      mammary artery is patent to the chest wall.   PLAN:  1.  We will consult CVTS for possible bypass.  Although he would be at      increased risk because comorbidities.  2.  He will need hemodialysis today.  3.  Continue risk factor modification.      Arvilla Meres, M.D. [facility]  Electronically Signed     DB/MEDQ  D:  04/09/2005  T:  04/09/2005  Job:  [id number]

## 2010-10-24 NOTE — Discharge Summary (Signed)
NAMEJDEN, WANT NO.:  [id number]   MEDICAL RECORD NO.:  [id number]          PATIENT TYPE:  INP   LOCATION:  2023                         FACILITY:  [facility]   PHYSICIAN:  Coral Ceo, P.A.     DATE OF BIRTH:  23-May-1928   DATE OF ADMISSION:  04/08/2005  DATE OF DISCHARGE:                                 DISCHARGE SUMMARY   Audio too short to transcribe (less than 5 seconds)      Coral Ceo, P.A.     GC/MEDQ  D:  05/05/2005  T:  05/05/2005  Job:  [id number]

## 2010-10-24 NOTE — Discharge Summary (Signed)
Jonathan Padilla, Jonathan Padilla NO.:  [id number]   MEDICAL RECORD NO.:  [id number]          PATIENT TYPE:  INP   LOCATION:  2901                         FACILITY:  [facility]   PHYSICIAN:  Arvilla Meres, M.D. [facility] OF BIRTH:  10/31/1927   DATE OF ADMISSION:  11/17/2005  DATE OF DISCHARGE:                                 DISCHARGE SUMMARY   He has no known drug allergies.   PRINCIPAL DIAGNOSES:  1.  Admitted with fatigue for a 24-hour period.      1.  Third degree AV block noted in the emergency department.      2.  Concurrent potassium of 6.4.      3.  AV dissociation continues even after potassium normalized by          Kayexalate and hemodialysis on November 16, 2005.  2.  Discharging back to [facility], November 18, 2005, status post      implantation of Medtronic EnRhythm dual chamber pacemaker by Dr. Sherryl Manges.   SECONDARY DIAGNOSES:  1.  History of myocardial infarction with subsequent coronary artery bypass      graft surgery for 3-vessel coronary artery disease, November 2006.  2.  Congestive heart failure symptoms, class IIB.  3.  End-stage renal disease, hemodialysis Monday, Wednesday, Friday.  4.  Chronic obstructive pulmonary disease, history of tobacco habituation,      stopped smoking November 2006.  5.  Hypertension.  6.  Aortoiliac occlusive disease, status post resection and grafting of      abdominal aortic aneurysm.  7.  Dyslipidemia.  8.  Secondary hyperparathyroidism.  9.  Post coronary artery bypass graft surgery atrial fibrillation on      amiodarone.  10. Gout.   PROCEDURE:  November 18, 2005, implantation of Medtronic EnRhythm dual chamber  pacemaker by Dr. Sherryl Manges.  The patient is A sensing and V pacing at a  rate of 75.  He has had no post procedural complications.  Following  implantation, he will have hemodialysis here at [facility] without  heparin.   BRIEF HISTORY:  Jonathan Padilla is a 77 year old male.  He has a  history of  coronary artery disease, aortoiliac occlusive disease, end-stage renal  disease, COPD, hypertension, dyslipidemia, congestive heart failure, and  presents with WEARYING FATIGUE to [facility] after 36 hours at home.  He had a myocardial infarction in November 2006, with subsequent coronary  artery bypass graft surgery.  He has had resection and grafting of abdominal  aortic aneurysm at [facility].  He is on hemodialysis Monday,  Wednesday, Friday for the last 2-3 years.  He had atrial fibrillation post  coronary artery bypass graft surgery and is on amiodarone.  This dose was  possibly to be discontinued if the patient maintained a sinus rhythm.   On arrival at [facility], he was found to be in third degree AV  block.  His heart rate was 35 beats per minute.  His potassium was 6.4.  He  received Kayexalate and underwent hemodialysis on November 16, 2005.  It  is  thought that elevated potassium is secondary to dietary noncompliance and  Altace which was recently increased from 5 mg to 10 mg daily.  On the  morning of November 17, 2005, the potassium was 4.5.  Of note, the patient is on  both amiodarone and Coreg, both are on hold.  He received a transfer from  [facility] for implantation of permanent pacemaker.   HOSPITAL COURSE:  The patient presents stable with third degree heart block  from [facility] on November 17, 2005.  Laboratory studies were obtained  and hemodialysis arrangements were made for Wednesday, November 18, 2005.  The  patient has not had any respiratory distress or chest pain, only mild  dyspnea and most markedly fatigue.  He was seen by Dr. Sherryl Manges who  recommended implantation of permanent pacemaker and this was done on November 18, 2005 in the morning.  The patient has had no post procedural  complications.  There is no evidence of hematoma at the pacemaker insertion  site.  The patient is A sensing and V pacing.  He will have  hemodialysis  prior to transfer back to [facility].   It is recommended that not get the incision wet for the next 7 days and he  is to sponge bathe until Wednesday, November 25, 2005.  He is to continue  hemodialysis Monday, Wednesday, Friday.  He is asked not to lift anything  heavier than 10 pounds for the next 4 weeks.  Mobility instructions of the  right arm have been given to the patient prior to discharge.   He will discharge on the following medications:  1.  Altace 5 mg daily.  2.  Coreg 6.25 mg twice daily.  3.  Amiodarone 200 mg daily, possibly to discontinue if he maintains sinus      rhythm.  4.  Protonix 40 mg daily.  5.  Lipitor 40 mg daily at bedtime.  6.  Renagel 800 mg, 3 tablets before each meal and 1 tablet with snacks.  7.  Fibercon 2 tablets daily.  8.  Nitroglycerin 0.4 mg, 1 tab under the tongue every 5 minutes x3 doses as      needed for chest pain.  9.  Oxycodone 5 mg, 1-2 tabs every 4-6 hours as needed.  10. Advair 1-2 puffs daily.   He has followup at [facility], the [facility] office.  1.  To see Dr. Andee Lineman for an incision check and possible medicine      adjustment.  Thursday, June 28th at 2:15 p.m.  2.  He will present to the [facility] office at [facility], Thursday,      September 20th at 9:30 to have pacer check/reprogramming.   LABORATORY STUDIES THIS ADMISSION:  PT was 14.3, INR 1.1.  Complete blood  count, November 17, 2005, white cells 8.2, hemoglobin 12.2, hematocrit 37.5, and  platelets 273.  The BNP on November 17, 2005 was 421.  Serum  electrolytes 132 is the sodium, potassium 3.9, chloride 91, carbonate 27,  BUN is 43, creatinine 9.4, and glucose 139.  Electrocardiogram shows AV  dissociation with heart rate of 56 on admission to [facility] on  November 17, 2005.      Maple Mirza, P.A.      Arvilla Meres, M.D. [facility]  Electronically Signed    GM/MEDQ  D:  11/18/2005  T:  11/18/2005  Job:  [id number]  cc:   Paulita Cradle, NT  [facility]   Duke Salvia, M.D.  [address]  [address]  [place]  [place] [address]   Learta Codding, M.D. [facility]  [address]  [address]  [place]  [place] [address]   [facility] [place], [place]  Fax:  [phone]   patient to take on discharge  Room 2901  [facility]

## 2010-10-24 NOTE — Op Note (Signed)
[name], Jonathan Padilla NO.:  [id number]   MEDICAL RECORD NO.:  [id number]          PATIENT TYPE:  INP   LOCATION:  2310                         FACILITY:  [facility]   PHYSICIAN:  Shan Levans, M.D. LHCDATE OF BIRTH:  11-17-1927   DATE OF PROCEDURE:  04/17/2005  DATE OF DISCHARGE:                                 OPERATIVE REPORT   PROCEDURE PERFORMED:  Bronchoscopy.   PULMONOLOGIST:  Shan Levans, M.D.   ANESTHESIA:  One percent Xylocaine local.   PREPROCEDURE MEDICATIONS:  Versed 4 mg and fentanyl 100 mcg IV push.   INDICATIONS:  Right lung collapse.   DESCRIPTION OF PROCEDURE:  The bedside Olympus bronchoscope was introduced  via the oropharynx.  The upper airway was visualized.  This showed a severe  upper airway edema.  Attention was then directed to the lower airway.  Severe mucous plugging was seen with total occlusion of the right mainstem  bronchus with mucous extending up into the trachea.  This was suctioned  free.  Extensive tracheobronchitis was encountered.   The patient tolerated the procedure fairly poorly and is on bilevel support  at the end of the procedure; may yet require intubation.      Shan Levans, M.D. [facility]  Electronically Signed     PW/MEDQ  D:  04/17/2005  T:  04/18/2005  Job:  [id number]

## 2010-10-24 NOTE — Consult Note (Signed)
NAMEWAYLIN, Padilla NO.:  [id number]   MEDICAL RECORD NO.:  [id number]          PATIENT TYPE:  INP   LOCATION:  2901                         FACILITY:  [facility]   PHYSICIAN:  Duke Salvia, M.D.  DATE OF BIRTH:  December 16, 1927   DATE OF CONSULTATION:  11/18/2005  DATE OF DISCHARGE:                                   CONSULTATION   REASON FOR CONSULTATION:  Thank you very much for asking [place] to see [name] for electrophysiological consultation for complete heart block.   HISTORY OF PRESENT ILLNESS:  The patient presented to [facility] a  couple of days ago in third degree heart block. His potassium was elevated  at 6.4. Not withstanding normalization of his potassium, his complete heart  block persisted. He was also on long term amiodarone and carvediloltherapy,  which was discontinued but again, not withstanding, his complete heart block  has persisted. This has been associated with significant fatigue and light  headedness.   The patient has baseline coronary artery disease with prior bypass surgery  and ejection fraction to 40% to 45%. He has also had an abdominal aortic  aneurysm repair.   PAST MEDICAL HISTORY:  In addition to the above is notable for:  1.  End stage renal disease on dialysis.  2.  Hypertension.  3.  Chronic obstructive pulmonary disease  4.  Dyslipidemia.  5.  Systolic and diastolic heart failure.  6.  Paroxysmal atrial fibrillation.   OUTPATIENT MEDIATIONS:  Include Coreg 6.25, Lipitor, Altace 2.5 up to 5, and  amiodarone 200 b.i.d.   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   ALLERGIES:  NO KNOWN DRUG ALLERGIES.   REVIEW OF SYSTEMS:  Is noted on the intake sheet from Loura Pardon, PA, and is  not recounted here.   PHYSICAL EXAMINATION:  GENERAL:  An elderly African-American male appearing  the stated age of 85.  VITAL SIGNS:  Blood pressure 146/52, pulse 35 and  irregular.  HEENT:  Examination demonstrated no xanthomata.  NECK:  Veins were 6 to 7 cm. Carotids were brisk and full bilaterally  without bruits.  BACK:  Without kyphosis or scoliosis.  LUNGS:  Clear.  HEART:  Sounds were regular but bradycardiac. There was no S3.  ABDOMEN:  Soft with active bowel sounds.  EXTREMITIES:  Femoral pulses were 2+ and distal pulses were intact. There  was no clubbing, cyanosis, or edema.  NEUROLOGIC:  Examination was grossly normal.   LABORATORY DATA:  Electrocardiogram demonstrates ongoing complete heart  block that was the same normalization of his electrolytes.   IMPRESSION:  1.  Complete heart block, new onset.  2.  Persisting, not withstanding, correction of hyperkalemia and      discontinuation of his beta blocker (amiodarone still in his system.)  3.  Coronary artery disease.      1.  Status post coronary artery bypass grafting.      2.  Postoperative atrial fibrillation, on amiodarone.  4.  Congestive heart failure - diastolic.  5.  End stage renal disease, on dialysis.  6.  Chronic obstructive pulmonary disease, stopped smoking November 2006.  7.  Hypertension.   RECOMMENDATIONS:  Jonathan Padilla has complete heart block in the context of  transient hyperkalemia with Coreg therapy, which has been discontinued and  amiodarone therapy, which will undoubtedly be around for a long time.  Because of the above, it is appropriate to undertake dual chamber pacing; I  am concern about the potential issue of infection. We will plan to place the  pacemaker on the right side, away from the dialysis access. I have reviewed  with the family, this issue. They understand the risks and are willing to  proceed.           ______________________________  Duke Salvia, M.D.     SCK/MEDQ  D:  11/18/2005  T:  11/18/2005  Job:  [id number]   cc:   D. Mamie Laurel, M.D.  [facility]   Learta Codding, M.D. [facility]  [address]  [address]   [place]  [place] [address]   Electrophysiology Laboratory   [facility]

## 2010-10-24 NOTE — Consult Note (Signed)
NAMEVIRLAN, KEMPKER NO.:  [id number]   MEDICAL RECORD NO.:  [id number]          PATIENT TYPE:  INP   LOCATION:  4743                         FACILITY:  [facility]   PHYSICIAN:  Salvatore Decent. Cornelius Moras, M.D. DATE OF BIRTH:  04-Nov-1927   DATE OF CONSULTATION:  04/11/2005  DATE OF DISCHARGE:                                   CONSULTATION   REFERRING PHYSICIAN:  Dr. Jonesville Bing.   PRIMARY CARDIOLOGIST:  Dr. Learta Codding.   PRIMARY CARE PHYSICIAN:  Dr. Rudi Heap.   PRIMARY NEPHROLOGIST:  Dr. Jorja Loa.   REASON FOR CONSULTATION:  Severe three-vessel coronary artery disease,  status post acute non-Q-wave myocardial infarction.   HISTORY OF PRESENT ILLNESS:  Mr. Enriques is a 77 year old African-American  male from [place] with no documented previous history of coronary artery  disease but multiple cardiac risk factors including history of hypertension,  peripheral vascular disease, end-stage renal disease on dialysis,  longstanding tobacco abuse with chronic obstructive pulmonary disease,  hyperlipidemia, and at least one previous episode of class IV congestive  heart failure. The patient apparently was hospitalized in June 2006 with  congestive heart failure. An echocardiogram was performed at that time  demonstrating ejection fraction 45-50%. Cardiac catheterization was not  performed. The patient was treated medically.   On April 07, 2005, the patient developed episodes of recurrent substernal  chest pain that waxed and waned over the ensuing 12 hours. The pains were  somewhat atypical, occasionally described as epigastric, occasionally  radiating to the back, occasionally more up in the mid-chest. The pain was  not associated with shortness of breath, nausea, or diaphoresis. However,  the pains persisted, ultimately prompting him to present to the emergency  room at [facility] in [place] on November 1. Baseline  electrocardiograms were  without acute ischemic changes, although his  troponin level was abnormal. The patient was admitted to the hospital and  serial cardiac enzymes remained abnormal with slightly elevated CK-MB  fraction, (8.0) and a peak troponin-I of 1.15. The patient was stabilized  medically and transferred to [facility] where he underwent  cardiac catheterization the following day on April 09, 2005. This was  performed by Dr. Gala Romney and findings were notable for the presence of  severe three-vessel coronary artery disease with mild left ventricular  dysfunction. Cardiac surgical consultation was recommended. I was contacted  this morning (November 4) by Dr. Dietrich Pates and asked to see the patient in  consultation.   REVIEW OF SYSTEMS:  GENERAL: The patient reports excellent appetite. He has  not been gaining or losing weight recently. He has good energy level.  CARDIAC: Notable for the absence of any history of exertional chest  discomfort, chest pressure or shortness of breath in recent history. The  patient denies history of PND, orthopnea, lower extremity edema,  palpitations, near syncope. He denies any previous history of chest pain  prior to that which developed over the last several days prompting  hospitalization. RESPIRATORY: Notable for a recent dry cough over the last  two or three days which the  patient attributes to a head cold. The patient  denies productive cough, hemoptysis, wheezing. GASTROINTESTINAL: Negative.  The patient has no difficulty swallowing. The patient reports normal bowel  function. He denies hematochezia, hematemesis, melena. MUSCULOSKELETAL:  Notable for some chronic arthritis in his back. The patient has been treated  for gout in the past. This has not been problematic recently. NEUROLOGIC:  Negative. The patient denies symptoms suggestive of previous TIA or stroke.  GENITOURINARY: Negative. HEENT: Notable in that the patient is edentulous.  He has  somewhat diminished eyesight but this has been stable recently.  INFECTIOUS: Negative. PSYCHIATRIC: Negative.   PAST MEDICAL HISTORY:  1.  Hypertension.  2.  Peripheral vascular disease.  3.  End-stage renal disease on hemodialysis x3 years, currently on MWF      schedule at the [facility].  4.  Longstanding tobacco abuse, 65 pack-years.  5.  Hyperlipidemia.  6.  Chronic obstructive pulmonary disease.  7.  Gout.  8.  Secondary hyperparathyroidism.  9.  Congestive heart failure   PAST SURGICAL HISTORY:  1.  Resection and grafting of abdominal aortic aneurysm approximately three      years ago at [facility] in [place].  2.  Right carotid endarterectomy Dr. Cari Caraway several years ago.  3.  Appendectomy in the remote past.  4.  Left upper arm AV fistula for dialysis access.   FAMILY HISTORY:  Notable for the absence of premature coronary artery  disease.   SOCIAL HISTORY:  The patient is married and lives with his wife in  [place]. 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 He has a longstanding history of  tobacco use smoking between one-half and one pack of cigarettes per day for  many years. The patient had two children, six grandchildren and a large  family. The patient denies excessive alcohol consumption.   MEDICATIONS PRIOR TO ADMISSION:  1.  Protonix 40 milligrams twice daily.  2.  Isosorbide mononitrate 20 milligrams three times daily.  3.  Diltiazem SR 300 milligrams once daily.  4.  Hydralazine 25 milligrams three times daily.  5.  Toprol XL 50 milligrams once daily.  6.  Altace 10 milligrams once daily.  7.  Cozaar 100 milligrams once daily.  8.  Lipitor 10 milligrams once daily.  9.  Coreg 6.25 milligrams three times daily.  10. Quinine 260 milligrams as needed for leg cramps.  11. Indocin as needed for gout. 12. NitroQuick as needed for chest pain.    DRUG ALLERGIES:  None known.   PHYSICAL EXAM:  GENERAL:  The patient is a well-appearing African-American  male who appears his stated age and is in no acute distress. He is currently  afebrile and normotensive.  HEENT:  Exam essentially unremarkable. The patient is edentulous.  NECK:  Supple. There are no carotid bruits. There is a well-healed scar from  a right carotid endarterectomy. There is no jugular venous distension. There  is no palpable cervical or supraclavicular lymphadenopathy.  CHEST:  Auscultation of the chest reveals clear breath sounds with no  wheezes or rhonchi noted.  CARDIOVASCULAR:  Exam includes regular rate and rhythm. No murmurs, rubs or  gallops are appreciated. There is a patent left upper arm AV fistula with a  palpable thrill.  ABDOMEN:  The abdomen is soft, nontender. There are no palpable masses.  There is a well-healed scar from previous resection and grafting of  abdominal aortic aneurysm.  EXTREMITIES:  Warm and adequately perfused. There is no lower extremity  edema. There are changes of chronic varicose veins in both lower legs,  although these changes did not seem to extend above the knee. There is some  changes of chronic dry skin and hemosiderosis consistent with longstanding  hypertension, peripheral vascular disease and end-stage renal disease but  there are no open skin lesions or ulcerations.  RECTAL/GU:  Exams are both deferred.  NEUROLOGIC:  Examination is grossly nonfocal.   LABORATORY DATA:  Blood work obtained this hospitalization most recently  includes hemoglobin 13.2, hematocrit 42%, white blood count 8700, platelet  count 216,000, all obtained this morning. Serum electrolytes notable for  sodium 130, potassium 4.7, chloride 95, bicarbonate 22, BUN 28, creatinine  9.3. Two-view chest x-ray obtained demonstrates possible aneurysmal  enlargement of the ascending thoracic aorta. Lung fields are clear. The  heart size is normal.    DIAGNOSTIC TESTS:  Cardiac catheterization performed November2, 2006 by Dr.  Gala Romney is reviewed. This demonstrates severe three-vessel coronary artery  disease with mild left ventricular dysfunction. Specifically, there is 40-  50% distal stenosis of the left main coronary artery extending into a 50%  ostial stenosis of the left anterior descending coronary artery. There is 60-  70% stenosis of mid-left circumflex coronary artery. There is 95% proximal  stenosis of the first circumflex marginal branch and 95% stenosis in the  midportion of a large third circumflex marginal branch which is the dominant  branch of the left circumflex system. There is 95% stenosis of mid-left  anterior descending coronary artery arising just before but not involving  takeoff of a large second diagonal branch. There is 70-80% proximal stenosis  and 80% stenosis of mid-right coronary artery with right dominant coronary circulation. Left ventricular function is mildly reduced with ejection  fraction estimated 50%. There is no mitral regurgitation. The ascending  aorta does appear to be somewhat aneurysmally dilated, although the true  diameter of this cannot clearly be ascertained.  Aortic root injection was  not performed.   IMPRESSION:  Severe three-vessel coronary artery disease with mild left  ventricular dysfunction, acute coronary syndrome status post mild non-Q-wave  myocardial infarction. I agree that Mr. Dubose would probably best be  treated by elective coronary artery bypass grafting. He may have aneurysmal  enlargement of the ascending thoracic aorta and this will need to be  assessed further prior to making definitive plans for surgery. He will  certainly be at considerable risk for a variety of postoperative  complications due to his associated multiple comorbid medical problems, most  notably his dialysis dependent renal failure with longstanding hypertension  and severe peripheral vascular  disease as well as cerebrovascular disease,  longstanding tobacco abuse and COPD.   PLAN:  I have outlined options at length with Mr. Matranga and his entire  family. Alternative strategies have been discussed. He understands and  accepts the somewhat high risk nature of surgery and is eager to proceed as  described. He specifically understands and accepts all associated risks  including but not limited to risk of death, stroke, myocardial infarction,  congestive heart failure, respiratory failure, pneumonia, bleeding requiring  blood transfusion, arrhythmia, infection, recurrent coronary artery disease,  recurrent congestive heart failure. All their questions have been addressed.  We will tentatively plan to proceed with surgery on Monday by one of my  partners, although we will obtain CT angiogram of the aorta first as well as  the  arterial blood gas on room air.  Formal  pulmonary function tests may be indicated, and if the ascending aorta is  indeed significantly enlarged an echocardiogram might be helpful to further  assess the aortic valve with particular attention to the possibility of  bicuspid aortic valve or other valve related pathology.      Salvatore Decent. Cornelius Moras, M.D.  Electronically Signed     CHO/MEDQ  D:  04/11/2005  T:  04/11/2005  Job:  [id number]   cc:   Ernestina Penna, M.D.  Fax: [phone]   Learta Codding, M.D. [facility]  [address]  [address]  [place]  [place] [address]   Jorja Loa, M.D.  Fax: [phone]   Arvilla Meres, M.D. [facility]  [facility]  [address]  [place]  [place] [address]

## 2010-10-24 NOTE — Op Note (Signed)
NAMEELDRICK, PENICK NO.:  [id number]   MEDICAL RECORD NO.:  [id number]          PATIENT TYPE:  INP   LOCATION:  2310                         FACILITY:  [facility]   PHYSICIAN:  Sheliah Plane, MD    DATE OF BIRTH:  [birth year]   DATE OF PROCEDURE:  04/18/2005  DATE OF DISCHARGE:                                 OPERATIVE REPORT   PREOPERATIVE DIAGNOSIS:  Unstable angina.   POSTOPERATIVE DIAGNOSIS:  Unstable angina.   PROCEDURE:  Coronary artery bypass grafting x3 with left internal mammary  artery to the left anterior descending coronary artery, reverse saphenous  vein graft to the distal right coronary artery, reverse saphenous vein graft  to the internal circumflex coronary artery with endoscopic vein harvesting  from the right thigh.   SURGEON:  Sheliah Plane, M.D.   INDICATIONS:  The patient is a 77 year old male on chronic hemodialysis who  presented with unstable angina symptoms.  He underwent cardiac  catheterization which revealed significant three vessel coronary artery  disease and occluding a long high grade stenosis of the proximal LAD.  It  was decided because of the patient's age and chronic hemodialysis, it was  felt that coronary artery bypass grafting offered the best solution to his  coronary artery disease.  The patient agreed and signed informed consent.   DESCRIPTION OF PROCEDURE:  With Swan-Ganz and arterial monitors in place,  the patient underwent general endotracheal anesthesia without incident.  The  skin of the chest and legs was prepped with Betadine and draped in the usual  sterile manner.  Using a Guidant endovein harvesting system, the vein was  harvested from the right thigh and was of good quality and caliber.  A  median sternotomy was performed, and the left internal artery was dissected  down as a pedicle graft.  The distal artery was divided and had good free  flow.  The pericardium was opened.  Overall, ventricular function  appeared  preserved.  The patient was systemically heparinized.  The ascending aorta  and the atrium were cannulated in the aortic root.  The cardioplegia needle  was introduced into the ascending aorta.  The patient was placed on  cardiopulmonary bypass at 2.4 liters per minute per m sq.  Sites of  anastomoses were selected and dissected out of the epicardium.  Overall, the  patient's vein was without disease but was somewhat enlarged.   Attention was turned first to the distal right coronary artery which was of  good quality and approximately 2 mL in size.  Using a running 7-0 Prolene  distal anastomosis was performed.  In a similar fashion, the circumflex  coronary artery was identified, opened, and using a running 7-0 Prolene a  distal anastomosis was performed.  The mid left anterior descending coronary  artery to the distal third of the anterior descending coronary artery was  opened.  Using an 8-0 Prolene, a left internal mammary artery was dissected  down to the pedicle graft.  The distal left anterior descending coronary  artery admitted a 1.5 mm probe distally.  Using an 8-0 Prolene,  the left  internal mammary artery was anastomosed to the left anterior descending  coronary artery.  With the cross clamp still in place, two punch aortotomies  were performed and each of the two vein grafts were anastomosed to the  ascending aorta.  Air was evacuated from the grafts.  Partial occlusion  clamp was removed with prompt rise in the myocardial septal temperature.  Aortic cross clamp was removed with a cross clamp time of 58 minutes.  Sites  of anastomosis were inspected and were free of bleeding.  The patient was  then ventilated and weaned from cardiopulmonary bypass without difficulty.  He was decannulated in the usual fashion.  Protamine sulfate was  administered.  With the operative field hemostatic, two atrial and two  ventricular pacing wires were applied.  Graft markers were  applied.  Left  pleural tube and two mediastinal tubes were left in place.  The sternum was  closed with #6 stainless steel wire.  The fascia was closed with interrupted  0 Vicryl and running 3-0 Vicryl in the subcutaneous tissue in the skin  edges.  Dry dressings were applied.  Sponge and needle count was reported as  correct at the completion of the procedure.  The patient tolerated the  procedure without obvious complications and was transferred to the surgical  intensive care unit for further postoperative care.   DESCRIPTION OF PROCEDURE:      Sheliah Plane, MD  Electronically Signed     EG/MEDQ  D:  04/18/2005  T:  04/18/2005  Job:  [id number]   cc:   Magdalena Bing, M.D. [facility]  [address]  [address]  [place]  [place] [address]

## 2010-10-24 NOTE — Op Note (Signed)
NAMEESSAM, LOWDERMILK NO.:  [id number]   MEDICAL RECORD NO.:  [id number]          PATIENT TYPE:  INP   LOCATION:  2901                         FACILITY:  [facility]   PHYSICIAN:  Duke Salvia, M.D.  DATE OF BIRTH:  04/28/1928   DATE OF PROCEDURE:  11/18/2005  DATE OF DISCHARGE:                                 OPERATIVE REPORT   __________.   PREOPERATIVE DIAGNOSIS:  Complete heart block.   POSTOPERATIVE DIAGNOSIS:  Complete heart block.   OPERATION/PROCEDURE:  Dual-chamber pacemaker implantation.   DESCRIPTION OF PROCEDURE:  Following obtaining informed consent, the patient  was brought to the electrophysiology laboratory and placed on the  fluoroscopic table in the supine position.  The patient is chronically  dialyzed through the a left-sided access and so the right side was prepared  and drape.  After routine preparation and draping, lidocaine was infiltrated  in the prepectoral groove.  An incision was made and carried down to the  layer of the prepectoral fascia with electrocautery and sharp dissection.  A  pocket was formed similarly.  Hemostasis was obtained.   Thereafter, attention was turned to gain access to  the extrathoracic left  subclavian vein which was accomplished actually over the second rib without  difficulty.  A single venipuncture was accomplished.  A double-wire  technique was used and sequential 7-French tear-away introducer sheaths were  placed which passed the Medtronic 5076 42 cm __________ fixation ventricular  lead, serial #[id number] and a Medtronic 5076 45 cm __________ fixation  atrial lead, serial number [id number].  Under fluoroscopic guidance, these  were manipulated into the right ventricular septum and the right atrial  appendage remnant respectively where the bipolar R wave with 16.4 MV and a  pacing impedance of 961, a threshold of 0.8 volts and 0.5 msec with a  current threshold was 1 MA and the current of injury was  excellent.   The bipolar P wave was 2.9 MV and pacing impedance of 553 ohms and threshold  of 0.7 volts and 0.5 msec.  Current  threshold was 1.5 MA.  There was no  diaphragmatic packing at 10 volts.  Again the current of injury was  excellent.  These leads were then secured to the prepectoral fascia and were  then attached to a Medtronic EnRhythm P1501 pulse generator, serial  #[id number].  __________ pacing was identified.  The pocket was copiously  irrigated with antibiotic containing saline solution.  Hemostasis was  assured.  The leads in the pulse generator were placed in the pocket,  secured to the prepectoral fascia. Because of impending dialysis, Surgicel  was placed on the anterior and posterior aspects of the pocket.  The wound  was then closed in three layers in the normal fashion.  The wound was washed  and dried and Benzoin and Steri-Strips dressing was applied.  Needle counts,  sponge counts and instrument counts were correct at the end of the procedure  according to the staff.   The patient tolerated the procedure without apparent complication.   I spoke with Dr. Caryn Section from renal. He will  be dialyzed today without heparin.           ______________________________  Duke Salvia, M.D.     SCK/MEDQ  D:  11/18/2005  T:  11/18/2005  Job:  [id number]   cc:   Jorja Loa, M.D.  Fax: [phone]   Electrophysiology Laboratory   [facility]

## 2010-10-24 NOTE — Assessment & Plan Note (Signed)
[facility]                           ELECTROPHYSIOLOGY OFFICE NOTE   NAME:Heberlein, Rebecka Apley                       MRN:          [id number]  DATE:02/25/2006                            DOB:          Oct 09, 1927    Mr. Naves was seen in the device clinic today in the [facility] office February 25, 2006 for three-month follow up of his Medtronic EnRhythm 1501DR dual-  chamber pacemaker implanted June of 2007 for complete heart block. Mr.  Paras does state that he feels better and has much more energy since his  pacemaker was placed. Upon interrogation, battery voltage was 3.03 volts. In  the atrium, intrinsic amplitude is 2.4 millivolts with an impedence of 568  ohms and threshold of 1 volt at 0.4 millivolts. In the right ventricle,  intrinsic amplitude was 12.3 millivolts with an impedence of 552 ohms and  threshold of 1 volt at 0.4 milliseconds. Rate response was turned on, and  amplitudes were turned down to 2.0 and 2.5 volts according to protocol. Mr.  Brigham will be seen for followup in June of 2008 in the [place] office.                                   Cleatrice Burke, RN                                Duke Salvia, MD, [facility]   CF/MedQ  DD:  02/25/2006  DT:  02/26/2006  Job #:  [id number]

## 2010-10-24 NOTE — Consult Note (Signed)
Jonathan Padilla, COPPA NO.:  [id number]   MEDICAL RECORD NO.:  [id number]          PATIENT TYPE:  INP   LOCATION:  2310                         FACILITY:  [facility]   PHYSICIAN:  Shan Levans, M.D. LHCDATE OF BIRTH:  [birth year]   DATE OF CONSULTATION:  04/17/2005  DATE OF DISCHARGE:                                   CONSULTATION   CHIEF COMPLAINT:  Respiratory failure, total atelectasis of the right lung.   HISTORY OF PRESENT ILLNESS:  77 year old African American male with end  stage renal disease dialysis dependent was admitted on April 08, 2005,  with progressive ischemic heart disease, found to have triple vessel  disease, underwent bypass surgery on April 13, 2005.  Postoperatively, the  patient has had difficulty with upper airway obstruction and difficulty  handling secretions.  He has ultimately developed progressive respiratory  failure and progressive atelectasis of the right lung.  He also had  postoperative right hemopneumothorax.  The patient has a pre-existing  history of ejection fraction 40-45% with CHF and coronary artery disease,  hypertension, COPD with active smoking, peripheral vascular disease,  hyperlipidemia, hyperparathyroidism and gout.  He had an abdominal aortic  aneurysm repair in 2003 in the abdominal area and carotid endarterectomy in  the past.   PAST MEDICAL HISTORY:  Medical as noted above.  Also, end stage renal  disease secondary to hypertension.   CURRENT MEDICATIONS:  Protonix 40 mg daily, Colace daily, aspirin 325 mg  daily, Lipitor 40 mg daily, Advair 1 spray b.i.d., Hydralazine 25 mg t.i.d.,  Coreg t.i.d., iron supplementation, Aranesp every seven days, Cefepime has  been added.   ALLERGIES:  No known drug allergies.   FAMILY HISTORY:  Mother died of a stroke as did the father.   SOCIAL HISTORY:  The patient is married, he has smoked a pack a day for 65  years.   REVIEW OF SYSTEMS:  Otherwise,  noncontributory.   PHYSICAL EXAMINATION:  GENERAL:  Ill appearing African American male in mild respiratory distress.  VITAL SIGNS:  Temperature 97, blood pressure 105 to 120 over 55, pulse 90,  saturation 98% on bilevel support.  CHEST:  No breath sounds on the right, clear on the left, distant breath  sounds noted.  HEART:  Regular rate and rhythm without S3, normal S1 and S2.  ABDOMEN:  Soft, nontender.  EXTREMITIES:  No edema or clubbing.  SKIN:  Clear.  HEENT:  Oropharynx clear.   LABORATORY DATA:  Recent swallowing study showed severe oropharyngeal  dysphagia with difficulty transferring food to the throat.  Sodium 143,  potassium 4, chloride 102, CO2 28, BUN 31, creatinine 5.7, blood sugar 108,  calcium 8.6.  Hemoglobin 10, white count 14,000, platelet count 196.  Chest  x-ray showed total atelectasis of the right lung with a right pneumothorax,  chest tube in place.   IMPRESSION:  Severe dysphagia with aspiration and total collapse of the  right lung in a patient with previous bypass surgery, ischemic heart  disease, peripheral vascular disease, COPD, active smoking, dysphagia, upper  airway obstruction, difficult airway, hemopneumothorax on the  right,  hypertension, atrial fibrillation.   RECOMMENDATIONS:  Pursue bronchoscopy for removal of mucous plug, administer  IV steroids, intensify neb treatments.  Agree with Cefepime.  May yet  require intubation and mechanical ventilation.      Shan Levans, M.D. [facility]  Electronically Signed     PW/MEDQ  D:  04/17/2005  T:  04/17/2005  Job:  [id number]

## 2010-10-24 NOTE — Discharge Summary (Signed)
Jonathan Padilla, Jonathan Padilla NO.:  [id number]   MEDICAL RECORD NO.:  [id number]          PATIENT TYPE:  INP   LOCATION:  2023                         FACILITY:  [facility]   PHYSICIAN:  Sheliah Plane, MD    DATE OF BIRTH:  [birth year]   DATE OF ADMISSION:  04/08/2005  DATE OF DISCHARGE:  05/05/2005                                 DISCHARGE SUMMARY   PRIMARY ADMITTING DIAGNOSIS:  Chest pain.   DISCHARGE DIAGNOSES:  1.  Severe three-vessel coronary artery disease.  2.  Congestive heart failure.  3.  End stage renal disease on hemodialysis Monday, Wednesday, Friday in      [place].  4.  Hypertension.  5.  Chronic obstructive pulmonary disease.  6.  Peripheral vascular disease.  7.  Dyslipidemia.  8.  Postoperative atrial fibrillation.  9.  Ventilator-dependent respiratory failure secondary to mucus plugging.  10. Status post acute non-Q-wave myocardial infarction.  11. Postoperative anemia.  12. Secondary hyperparathyroidism.  13. Acute tracheobronchitis.  14. Spontaneous right pneumothorax.  15. Dysphagia.  16. Postoperative malnutrition.  17. Postoperative deconditioning.   PROCEDURES PERFORMED:  1.  Cardiac catheterization.  2.  Coronary artery bypass grafting x3 (left internal mammary artery to the      LAD, saphenous vein graft to the right coronary artery, saphenous vein      graft to the circumflex).  3.  Endoscopic vein harvest right thigh.  4.  Bronchoscopy.  5.  Placement of right chest tube.   HISTORY:  The patient is a 77 year old black male who was seen initially at  [facility] in [place] on April 08, 2005 complaining of chest pain. He  awoke with substernal chest pressure which extended into the epigastrium and  did not radiate. It was not associated with shortness of breath or  diaphoresis. He was admitted for evaluation and cardiac enzymes were  abnormal with slightly elevated CK-MB fraction at 8.0 and a peak troponin I  of 1.15. He was  stabilized medically and was transferred to [facility] for cardiac catheterization.   HOSPITAL COURSE:  He was admitted to [facility] on April 08, 2005. He underwent cardiac catheterization on April 09, 2005, and was  found to have severe three-vessel coronary artery disease with mildly  depressed left ventricular function and 95% left vertebral stenosis. He was  felt to be a poor candidate for percutaneous intervention. A cardiothoracic  surgery consultation was obtained and the patient was seen by Dr. Tressie Stalker. Dr. Cornelius Moras agreed that his best course of action would proceed with  surgical revascularization at this time. He explained the risks, benefits  and alternatives of the procedure to the patient and his family and they  agreed to proceed. Because of scheduling, Dr. Cornelius Moras was unable to perform the  surgery and the patient was also seen by Dr. Tyrone Sage who agrees to take  over his care.   Prior to his surgery, he underwent a complete preoperative workup which  included carotid Doppler study. This showed no ICA stenosis on the right  with a 40-60% ICA  stenosis on the left. He also underwent a CT angiogram of  the aorta which showed the ascending aorta at less than 4 cm in diameter  with a descending aorta between 4.0 and 4.4 cm diameter. He remained stable  in the hospital during his preoperative workup and he was able to be taken  to the operating room on April 13, 2005 where he underwent CABG x3 as  described in detail above performed by Dr. Tyrone Sage. He tolerated the  procedure well and he was transferred to the SICU in stable condition.  Postoperatively, he was able to be extubated shortly after surgery. He was  hemodynamically stable and doing fairly well on postoperative day #1. He  remained in the unit for overnight observation.   On postoperative day #2, late in the afternoon, he became apneic and  unresponsive while out of bed to chair. He was  placed on an O2 mask with  manual ventilation and he became responsive once again and intubation was  not required at that time. A chest x-ray was performed stat which showed a  right apical pneumothorax with some subcutaneous air noted on physical exam.  We felt that this should be treated conservatively and monitored closely;  however, on follow-up chest x-ray, he continued to have a right  hemopneumothorax. Therefore on April 16, 2005 a right chest tube was  placed and approximately 500 cc of bloody fluid was removed. He continued to  have respiratory issues requiring BiPAP and aggressive pulmonary toilet  measures. He developed a productive cough with purulent sputum and was  started empirically on Maxipime. On April 17, 2005, his condition  deteriorated and he underwent a fiberoptic bronchoscopy by Dr. Delford Field. The  bronchoscopy showed extensive mucus plugging which was removed from the  right lung. This had occluded the right mainstem bronchus and part of the  trachea. He was also noted to have a severe tracheobronchitis. He did  ultimately require reintubation and mechanical ventilation. Because his  deconditioned state and his return to the ventilator, a panda tube was  placed and he was started on tube feeds for parenteral nutrition. His chest  x-ray on April 18, 2005 showed tension right pneumothorax. The existing  chest tube was found to be in the fissure and nonfunctional;  therefore, he  required replacement of right-sided chest tube on that date. Follow-up chest  x-ray showed resolution of a tension with reexpansion of the lung. Following  the replacement of his chest tube, his pulmonary status slowly began to  improve. The chest tube remained to suction but he was able to be extubated  on April 20, 2005. He was slowly able to be mobilized although he was very weak and deconditioned and physical therapy was consulted for  assistance. During his entire  hospitalization, he has been followed by the  renal service and has continued on hemodialysis. He has slowly began to make  improvement and underwent a FEES study on April 22, 2005. At that time,  it was recommended for him to proceed with dysphagia II diet with nectar-  thickened liquids and aspiration precautions. He was started on p.o.'s and  his tube feeds were weaned as his p.o. intake improved. His blood pressures  have been somewhat labile during this admission. His medication regimen has  been paired down at this point because of some recent hypotension and his  blood pressures are now running in the 100 systolic range. He also developed  atrial fibrillation postoperatively and was started  on amiodarone after  which time he converted to normal sinus rhythm. His respiratory cultures  were ultimately positive for Citrobacter which was sensitive to imipenem and  cefepime. His white count had remained elevated and he was afebrile, because  of these reasons, he was started on IV antibiotics and at this point has  completed a 10-day course of Maxipime. He presently is afebrile and is  maintaining O2 saturations of greater than 90% on room air.   He has continued to make slow but steady improvement. His chest tube was  ultimately able to be removed on April 30, 2005. Follow-up chest x-rays  have shown no evidence of recurrent pneumothorax. His most recent chest x-  ray shows improved basilar aeration with basilar atelectasis and bilateral  pleural effusions. On May 02, 2005, he was able to be transferred to  the floor. During this admission, he has been anemic and has required a  transfusion of packed red blood cells. Presently, his hemoglobin and  hematocrit are stable at 10.2 and 30.9 respectively. The remainder of his  recent labs show a platelet count 278,000, white blood cell count 7.8,  sodium 142, potassium 4.0, BUN 32, creatinine 7.6. Since his transfer to the  floor,  he has been ambulating with cardiac rehabilitation phase one,  although he is still somewhat weak and deconditioned. He is ambulating up to  300 feet at a time. His FEES study has been repeated and his diet has been  advanced to a dysphagia III diet with thin liquids. He continues with  aspiration precautions and outpatient swallowing evaluation has been  recommended.   On exam, his surgical incision sites have all healed well. His lungs were  clear and he is maintaining normal sinus rhythm. He is afebrile and all  vital signs were stable including O2 saturations greater than 95% on room  air. He was not significantly volume overloaded at this time. He did  complain of some right hip pain on May 05, 2005 and underwent a right  hip x-ray which showed evidence of significant degenerative joint disease  but no fracture or other acute process. It is felt with all parties involved in his care that since he is progressing well he may be discharged home at  this time.   DISCHARGE MEDICATIONS:  1.  Enteric-coated aspirin 325 milligrams q.d.  2.  Coreg 6.25 milligrams q.8h.  3.  Amiodarone 2 milligrams b.i.d.  4.  Protonix 40 milligrams b.i.d.  5.  Lipitor 40 milligrams q.h.s.  6.  Altace 2.5 milligrams b.i.d.  7.  Os-Cal 1000 milligrams t.i.d.  8.  Tylox one to two q.4h. p.r.n. for pain.  9.  Also he has been receiving Zemplar 2.5 mcg on Monday, Wednesday and      Friday at hemodialysis as well as InFeD 50 milligrams once a week. It      will be up to his regular nephrologist, Dr. Kristian Covey whether these will      be continued postdischarge or not.   DISCHARGE INSTRUCTIONS:  He is asked to refrain from driving, heavy lifting  or strenuous activity. He may continue ambulating daily and using his  incentive spirometer. He will shower daily and clean his incisions with soap  and water. He will continue a low-fat, low-sodium diet with renal  restrictions. Home health has been arranged to  include nurse aid, physical  therapist and speech therapist. He will also have a rolling walker for home  use.   DISCHARGE FOLLOWUP:  He will follow up on December 20 with Dr. Andee Lineman in the  [facility] office. He will not need to follow up with Dr.  Tyrone Sage unless any acute problems occur. He will continue his same  hemodialysis schedule on Monday, Wednesday and Friday at the [facility] in [place]. He will call our office in the interim if he experiences  any problems or has questions.      Coral Ceo, P.A.      Sheliah Plane, MD  Electronically Signed    GC/MEDQ  D:  05/05/2005  T:  05/05/2005  Job:  [id number]   cc:   Learta Codding, M.D. [facility]  [address]  [address]  [place]  [place] [address]   Lia Hopping  Fax: [phone]   Jorja Loa, M.D.  Fax: [phone]   [facility]  [place], [place]  Fax [phone]

## 2010-12-06 ENCOUNTER — Emergency Department (HOSPITAL_COMMUNITY)
Admission: EM | Admit: 2010-12-06 | Discharge: 2010-12-06 | Disposition: A | Discharge status: Home / Self Care | Payer: Medicare Other | Attending: Emergency Medicine | Admitting: Emergency Medicine

## 2010-12-06 ENCOUNTER — Emergency Department (HOSPITAL_COMMUNITY): Payer: Medicare Other

## 2010-12-06 DIAGNOSIS — J189 Pneumonia, unspecified organism: Secondary | ICD-10-CM | POA: Insufficient documentation

## 2010-12-06 DIAGNOSIS — I509 Heart failure, unspecified: Secondary | ICD-10-CM | POA: Insufficient documentation

## 2010-12-06 DIAGNOSIS — N186 End stage renal disease: Secondary | ICD-10-CM | POA: Insufficient documentation

## 2010-12-06 DIAGNOSIS — I120 Hypertensive chronic kidney disease with stage 5 chronic kidney disease or end stage renal disease: Secondary | ICD-10-CM | POA: Insufficient documentation

## 2010-12-06 DIAGNOSIS — R5381 Other malaise: Secondary | ICD-10-CM | POA: Insufficient documentation

## 2010-12-06 DIAGNOSIS — R5383 Other fatigue: Secondary | ICD-10-CM | POA: Insufficient documentation

## 2010-12-06 DIAGNOSIS — R059 Cough, unspecified: Secondary | ICD-10-CM | POA: Insufficient documentation

## 2010-12-06 DIAGNOSIS — Z79899 Other long term (current) drug therapy: Secondary | ICD-10-CM | POA: Insufficient documentation

## 2010-12-06 DIAGNOSIS — Z951 Presence of aortocoronary bypass graft: Secondary | ICD-10-CM | POA: Insufficient documentation

## 2010-12-06 DIAGNOSIS — R0609 Other forms of dyspnea: Secondary | ICD-10-CM | POA: Insufficient documentation

## 2010-12-06 DIAGNOSIS — R05 Cough: Secondary | ICD-10-CM | POA: Insufficient documentation

## 2010-12-06 DIAGNOSIS — R0602 Shortness of breath: Secondary | ICD-10-CM | POA: Insufficient documentation

## 2010-12-06 DIAGNOSIS — J4489 Other specified chronic obstructive pulmonary disease: Secondary | ICD-10-CM | POA: Insufficient documentation

## 2010-12-06 DIAGNOSIS — J449 Chronic obstructive pulmonary disease, unspecified: Secondary | ICD-10-CM | POA: Insufficient documentation

## 2010-12-06 DIAGNOSIS — I2510 Atherosclerotic heart disease of native coronary artery without angina pectoris: Secondary | ICD-10-CM | POA: Insufficient documentation

## 2010-12-06 DIAGNOSIS — J90 Pleural effusion, not elsewhere classified: Secondary | ICD-10-CM | POA: Insufficient documentation

## 2010-12-06 DIAGNOSIS — I4891 Unspecified atrial fibrillation: Secondary | ICD-10-CM | POA: Insufficient documentation

## 2010-12-06 DIAGNOSIS — R0989 Other specified symptoms and signs involving the circulatory and respiratory systems: Secondary | ICD-10-CM | POA: Insufficient documentation

## 2010-12-06 DIAGNOSIS — Z950 Presence of cardiac pacemaker: Secondary | ICD-10-CM | POA: Insufficient documentation

## 2010-12-06 LAB — DIFFERENTIAL
Basophils Absolute: 0 10*3/uL (ref 0.0–0.1)
Basophils Relative: 0 % (ref 0–1)
Eosinophils Absolute: 0.2 10*3/uL (ref 0.0–0.7)
Eosinophils Relative: 3 % (ref 0–5)
Lymphocytes Relative: 11 % — ABNORMAL LOW (ref 12–46)
Lymphs Abs: 0.6 10*3/uL — ABNORMAL LOW (ref 0.7–4.0)
Monocytes Absolute: 0.9 10*3/uL (ref 0.1–1.0)
Monocytes Relative: 18 % — ABNORMAL HIGH (ref 3–12)
Neutro Abs: 3.6 10*3/uL (ref 1.7–7.7)
Neutrophils Relative %: 69 % (ref 43–77)

## 2010-12-06 LAB — CBC
HCT: 36.4 % — ABNORMAL LOW (ref 39.0–52.0)
Hemoglobin: 11.6 g/dL — ABNORMAL LOW (ref 13.0–17.0)
MCH: 26.2 pg (ref 26.0–34.0)
MCV: 82.4 fL (ref 78.0–100.0)
RBC: 4.42 MIL/uL (ref 4.22–5.81)
WBC: 5.3 10*3/uL (ref 4.0–10.5)

## 2010-12-06 LAB — BASIC METABOLIC PANEL
BUN: 18 mg/dL (ref 6–23)
CO2: 25 mEq/L (ref 19–32)
Calcium: 9.7 mg/dL (ref 8.4–10.5)
Chloride: 96 mEq/L (ref 96–112)
Creatinine, Ser: 5.49 mg/dL — ABNORMAL HIGH (ref 0.50–1.35)
GFR calc non Af Amer: 10 mL/min — ABNORMAL LOW (ref >60)
Glucose, Bld: 86 mg/dL (ref 70–99)
Potassium: 3.8 mEq/L (ref 3.5–5.1)

## 2010-12-06 LAB — TROPONIN I: Troponin I: <0.3 ng/mL (ref <0.30)

## 2011-07-15 ENCOUNTER — Encounter: Payer: Self-pay | Admitting: *Deleted

## 2011-07-17 ENCOUNTER — Encounter: Payer: Medicaid Other | Admitting: Internal Medicine

## 2011-08-26 ENCOUNTER — Encounter: Payer: Self-pay | Admitting: Internal Medicine

## 2011-08-26 ENCOUNTER — Telehealth: Payer: Self-pay | Admitting: *Deleted

## 2011-08-26 ENCOUNTER — Ambulatory Visit (INDEPENDENT_AMBULATORY_CARE_PROVIDER_SITE_OTHER): Payer: Medicare Other | Admitting: Internal Medicine

## 2011-08-26 VITALS — BP 118/71 | HR 78 | Ht 76.0 in | Wt 147.0 lb

## 2011-08-26 DIAGNOSIS — I442 Atrioventricular block, complete: Secondary | ICD-10-CM

## 2011-08-26 DIAGNOSIS — I441 Atrioventricular block, second degree: Secondary | ICD-10-CM | POA: Insufficient documentation

## 2011-08-26 LAB — PACEMAKER DEVICE OBSERVATION
AL AMPLITUDE: 2.6 mv
AL IMPEDENCE PM: 504 Ohm
AL THRESHOLD: 0.5 V
ATRIAL PACING PM: 3.95
BAMS-0001: 170 {beats}/min
BATTERY VOLTAGE: 2.97 V
RV LEAD AMPLITUDE: 11 mv
RV LEAD IMPEDENCE PM: 480 Ohm
RV LEAD THRESHOLD: 1 V
VENTRICULAR PACING PM: 14.9

## 2011-08-26 NOTE — Progress Notes (Signed)
The patient presents today for routine electrophysiology followup.  Since last being seen in our clinic, the patient reports doing very well.  He remains quite active for his age.  He goes to dialysis MWF and says that he is tolerating this well.   Today, he denies symptoms of palpitations, chest pain, shortness of breath, orthopnea, PND, lower extremity edema, dizziness, presyncope, or syncope.  The patient feels that he is tolerating medications without difficulties and is otherwise without complaint today.   Past Medical History  Diagnosis Date  . Hyperlipidemia   . Hypertension   . Congestive heart failure, unspecified   . Coronary atherosclerosis of native coronary artery   . 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 End stage renal disease     on HD MWF  . Aortoiliac occlusive disease   . Secondary hyperparathyroidism   . Gout    Past Surgical History  Procedure Date  . Coronary artery bypass graft 04/18/05    Rachell Cipro  . Ppm 11/18/05    Medtronic EnRhythm P1501-complete heart block    Current Outpatient Prescriptions  Medication Sig Dispense Refill  . amLODipine (NORVASC) 10 MG tablet Take 10 mg by mouth daily.      Marland Kitchen atorvastatin (LIPITOR) 40 MG tablet Take 40 mg by mouth daily.      . carvedilol (COREG) 6.25 MG tablet Take 6.25 mg by mouth 2 (two) times daily with a meal.      . isosorbide mononitrate (IMDUR) 30 MG 24 hr tablet Take 30 mg by mouth daily.      Marland Kitchen losartan (COZAAR) 100 MG tablet Take 100 mg by mouth daily.      . multivitamin (RENA-VIT) TABS tablet Take 1 tablet by mouth daily.      . pantoprazole (PROTONIX) 40 MG tablet Take 40 mg by mouth daily.      . sevelamer (RENAGEL) 800 MG tablet Take 800 mg by mouth as directed. 4 with meals and 2 with snacks        No Known Allergies  History   Social History  . Marital Status: Married    Spouse Name: N/A    Number of Children: N/A  . Years of Education: N/A   Occupational  History  . Not on file.   Social History Main Topics  . Smoking status: Former Smoker -- 1.0 packs/day for 50 years    Types: Cigarettes    Quit date: 06/09/1995  . Smokeless tobacco: Never Used  . Alcohol Use: Not on file  . Drug Use: Not on file  . Sexually Active: Not on file   Other Topics Concern  . Not on file   Social History Narrative  . No narrative on file    Physical Exam: Filed Vitals:   08/26/11 0950  BP: 118/71  Pulse: 78  Height: 6\' 4"  (1.93 m)  Weight: 147 lb (66.679 kg)    GEN- The patient is well appearing, alert and oriented x 3 today.   Head- normocephalic, atraumatic Eyes-  Sclera clear, conjunctiva pink Ears- hearing intact Oropharynx- clear Neck- supple  Lungs- Clear to ausculation bilaterally, normal work of breathing Chest- pacemaker pocket is well healed Heart- Regular rate and rhythm  GI- soft, NT, ND, + BS Extremities- no clubbing, cyanosis, or edema  Pacemaker interrogation- reviewed in detail today,  See PACEART report  Assessment and Plan:

## 2011-08-26 NOTE — Telephone Encounter (Signed)
Wife states that patient was taken off losartan about 3 weeks ago. Medication list updated.

## 2011-08-26 NOTE — Assessment & Plan Note (Signed)
He has had prior transient AV dissociation.  He is not pacemaker dependant at this time.  Normal pacemaker function See Arit[facility] Miss Art report No changes today  carelink every 3 months, I will see him in a year

## 2011-11-26 ENCOUNTER — Encounter: Payer: Medicare Other | Admitting: *Deleted

## 2011-12-07 ENCOUNTER — Encounter: Payer: Self-pay | Admitting: *Deleted

## 2012-02-03 ENCOUNTER — Encounter: Payer: Self-pay | Admitting: *Deleted

## 2012-02-06 ENCOUNTER — Encounter: Payer: Self-pay | Admitting: Internal Medicine

## 2012-02-09 ENCOUNTER — Ambulatory Visit (INDEPENDENT_AMBULATORY_CARE_PROVIDER_SITE_OTHER): Payer: Medicare Other | Admitting: *Deleted

## 2012-02-09 DIAGNOSIS — I441 Atrioventricular block, second degree: Secondary | ICD-10-CM

## 2012-02-12 LAB — REMOTE PACEMAKER DEVICE
AL AMPLITUDE: 2.3 mv
AL IMPEDENCE PM: 480 Ohm
ATRIAL PACING PM: 3.61
BAMS-0001: 170 {beats}/min
BATTERY VOLTAGE: 2.96 V
RV LEAD IMPEDENCE PM: 448 Ohm
VENTRICULAR PACING PM: 34.55

## 2012-03-11 ENCOUNTER — Encounter: Payer: Self-pay | Admitting: *Deleted

## 2012-05-16 ENCOUNTER — Encounter: Payer: Medicare Other | Admitting: *Deleted

## 2012-05-26 ENCOUNTER — Encounter: Payer: Self-pay | Admitting: *Deleted

## 2012-07-01 ENCOUNTER — Telehealth: Payer: Self-pay | Admitting: Internal Medicine

## 2012-07-01 NOTE — Telephone Encounter (Signed)
Needs to reschedule the remote check from home he missed in Dec, 2013

## 2012-07-01 NOTE — Telephone Encounter (Signed)
N/A /kwm  

## 2012-07-05 NOTE — Telephone Encounter (Signed)
Spoke w/pt and pt to send transmission on ppm anytime.

## 2012-07-16 ENCOUNTER — Other Ambulatory Visit: Payer: Self-pay | Admitting: Internal Medicine

## 2012-07-18 ENCOUNTER — Ambulatory Visit (INDEPENDENT_AMBULATORY_CARE_PROVIDER_SITE_OTHER): Payer: Medicare Other | Admitting: *Deleted

## 2012-07-18 DIAGNOSIS — I441 Atrioventricular block, second degree: Secondary | ICD-10-CM

## 2012-07-18 DIAGNOSIS — Z950 Presence of cardiac pacemaker: Secondary | ICD-10-CM

## 2012-07-21 LAB — REMOTE PACEMAKER DEVICE
AL AMPLITUDE: 2.1474 mv
AL IMPEDENCE PM: 464 Ohm
ATRIAL PACING PM: 0.24
BATTERY VOLTAGE: 2.96 V
RV LEAD AMPLITUDE: 8.233 mv
RV LEAD IMPEDENCE PM: 448 Ohm
VENTRICULAR PACING PM: 13.02

## 2012-07-25 ENCOUNTER — Encounter: Payer: Self-pay | Admitting: *Deleted

## 2012-07-25 ENCOUNTER — Other Ambulatory Visit: Payer: Self-pay | Admitting: *Deleted

## 2012-07-25 DIAGNOSIS — I509 Heart failure, unspecified: Secondary | ICD-10-CM

## 2012-07-28 ENCOUNTER — Encounter (HOSPITAL_COMMUNITY): Payer: Self-pay | Admitting: Respiratory Therapy

## 2012-07-29 ENCOUNTER — Encounter (HOSPITAL_COMMUNITY): Payer: Self-pay

## 2012-07-29 ENCOUNTER — Other Ambulatory Visit (HOSPITAL_COMMUNITY): Payer: Self-pay | Admitting: Family Medicine

## 2012-07-29 ENCOUNTER — Ambulatory Visit (HOSPITAL_COMMUNITY)
Admission: RE | Admit: 2012-07-29 | Discharge: 2012-07-29 | Disposition: A | Discharge status: Home / Self Care | Payer: Medicare Other | Source: Ambulatory Visit | Attending: Family Medicine | Admitting: Family Medicine

## 2012-07-29 DIAGNOSIS — R1032 Left lower quadrant pain: Secondary | ICD-10-CM | POA: Insufficient documentation

## 2012-07-29 DIAGNOSIS — I714 Abdominal aortic aneurysm, without rupture, unspecified: Secondary | ICD-10-CM | POA: Insufficient documentation

## 2012-07-29 DIAGNOSIS — R109 Unspecified abdominal pain: Secondary | ICD-10-CM

## 2012-07-29 DIAGNOSIS — K8020 Calculus of gallbladder without cholecystitis without obstruction: Secondary | ICD-10-CM | POA: Insufficient documentation

## 2012-07-29 DIAGNOSIS — R932 Abnormal findings on diagnostic imaging of liver and biliary tract: Secondary | ICD-10-CM | POA: Insufficient documentation

## 2012-07-29 HISTORY — DX: Dependence on renal dialysis: Z99.2

## 2012-07-29 HISTORY — DX: Acute myocardial infarction, unspecified: I21.9

## 2012-07-29 MED ORDER — IOHEXOL 300 MG/ML  SOLN
100.0000 mL | Freq: Once | INTRAMUSCULAR | Status: AC | PRN
  Administered 2012-07-29: 100 mL via INTRAVENOUS

## 2012-08-01 LAB — POCT I-STAT, CHEM 8
BUN: 43 mg/dL — ABNORMAL HIGH (ref 6–23)
Creatinine, Ser: 7.5 mg/dL — ABNORMAL HIGH (ref 0.50–1.35)
Potassium: 4.7 mEq/L (ref 3.5–5.1)
Sodium: 132 mEq/L — ABNORMAL LOW (ref 135–145)

## 2012-08-04 ENCOUNTER — Telehealth: Payer: Self-pay | Admitting: Internal Medicine

## 2012-08-04 MED ORDER — CEFAZOLIN SODIUM-DEXTROSE 2-3 GM-% IV SOLR: 2.0000 g | INTRAVENOUS | Status: DC

## 2012-08-04 MED ORDER — SODIUM CHLORIDE 0.9 % IR SOLN
80.0000 mg | Status: DC
  Filled 2012-08-04 (×2): qty 2

## 2012-08-04 NOTE — Telephone Encounter (Signed)
New Prob    Pt daughter has some questions about procedure tomorrow and having to potentially stay overnight. Concerned bc pt has dialysis the next morning. Would like to speak to nurse.

## 2012-08-05 ENCOUNTER — Encounter (HOSPITAL_COMMUNITY)
Admission: RE | Disposition: A | Discharge status: Home / Self Care | Payer: Self-pay | Source: Ambulatory Visit | Attending: Internal Medicine

## 2012-08-05 ENCOUNTER — Ambulatory Visit (HOSPITAL_COMMUNITY): Payer: Medicare Other

## 2012-08-05 ENCOUNTER — Encounter: Payer: Self-pay | Admitting: Internal Medicine

## 2012-08-05 ENCOUNTER — Ambulatory Visit (HOSPITAL_COMMUNITY)
Admission: RE | Admit: 2012-08-05 | Discharge: 2012-08-05 | Disposition: A | Discharge status: Home / Self Care | Payer: Medicare Other | Source: Ambulatory Visit | Attending: Internal Medicine | Admitting: Internal Medicine

## 2012-08-05 DIAGNOSIS — I252 Old myocardial infarction: Secondary | ICD-10-CM | POA: Insufficient documentation

## 2012-08-05 DIAGNOSIS — Z45018 Encounter for adjustment and management of other part of cardiac pacemaker: Secondary | ICD-10-CM | POA: Insufficient documentation

## 2012-08-05 DIAGNOSIS — Z992 Dependence on renal dialysis: Secondary | ICD-10-CM | POA: Insufficient documentation

## 2012-08-05 DIAGNOSIS — I120 Hypertensive chronic kidney disease with stage 5 chronic kidney disease or end stage renal disease: Secondary | ICD-10-CM | POA: Insufficient documentation

## 2012-08-05 DIAGNOSIS — E785 Hyperlipidemia, unspecified: Secondary | ICD-10-CM | POA: Insufficient documentation

## 2012-08-05 DIAGNOSIS — I441 Atrioventricular block, second degree: Secondary | ICD-10-CM | POA: Insufficient documentation

## 2012-08-05 DIAGNOSIS — N2581 Secondary hyperparathyroidism of renal origin: Secondary | ICD-10-CM | POA: Insufficient documentation

## 2012-08-05 DIAGNOSIS — I2510 Atherosclerotic heart disease of native coronary artery without angina pectoris: Secondary | ICD-10-CM | POA: Insufficient documentation

## 2012-08-05 DIAGNOSIS — I509 Heart failure, unspecified: Secondary | ICD-10-CM | POA: Insufficient documentation

## 2012-08-05 DIAGNOSIS — N186 End stage renal disease: Secondary | ICD-10-CM | POA: Insufficient documentation

## 2012-08-05 HISTORY — PX: PACEMAKER GENERATOR CHANGE: SHX5998

## 2012-08-05 HISTORY — PX: PERMANENT PACEMAKER GENERATOR CHANGE: SHX6022

## 2012-08-05 LAB — BASIC METABOLIC PANEL
CO2: 26 mEq/L (ref 19–32)
Chloride: 95 mEq/L — ABNORMAL LOW (ref 96–112)
GFR calc Af Amer: 9 mL/min — ABNORMAL LOW (ref >90)
Sodium: 137 mEq/L (ref 135–145)

## 2012-08-05 LAB — CBC
Hemoglobin: 11.1 g/dL — ABNORMAL LOW (ref 13.0–17.0)
MCH: 28.8 pg (ref 26.0–34.0)
MCV: 88.9 fL (ref 78.0–100.0)
RBC: 3.86 MIL/uL — ABNORMAL LOW (ref 4.22–5.81)
WBC: 12.5 10*3/uL — ABNORMAL HIGH (ref 4.0–10.5)

## 2012-08-05 LAB — SURGICAL PCR SCREEN: MRSA, PCR: POSITIVE — AB

## 2012-08-05 SURGERY — PERMANENT PACEMAKER GENERATOR CHANGE
Anesthesia: LOCAL

## 2012-08-05 MED ORDER — ACETAMINOPHEN 325 MG PO TABS: 325.0000 mg | ORAL_TABLET | ORAL | Status: DC | PRN

## 2012-08-05 MED ORDER — SODIUM CHLORIDE 0.9 % IV SOLN: 250.0000 mL | INTRAVENOUS | Status: DC

## 2012-08-05 MED ORDER — SODIUM CHLORIDE 0.9 % IJ SOLN: 3.0000 mL | INTRAMUSCULAR | Status: DC | PRN

## 2012-08-05 MED ORDER — MUPIROCIN 2 % EX OINT
TOPICAL_OINTMENT | CUTANEOUS | Status: AC
  Filled 2012-08-05: qty 22

## 2012-08-05 MED ORDER — CHLORHEXIDINE GLUCONATE 4 % EX LIQD: 60.0000 mL | Freq: Once | CUTANEOUS | Status: DC

## 2012-08-05 MED ORDER — ONDANSETRON HCL 4 MG/2ML IJ SOLN: 4.0000 mg | Freq: Four times a day (QID) | INTRAMUSCULAR | Status: DC | PRN

## 2012-08-05 MED ORDER — LIDOCAINE HCL (PF) 1 % IJ SOLN
INTRAMUSCULAR | Status: AC
  Filled 2012-08-05: qty 60

## 2012-08-05 MED ORDER — SODIUM CHLORIDE 0.45 % IV SOLN: INTRAVENOUS | Status: DC

## 2012-08-05 MED ORDER — SODIUM CHLORIDE 0.9 % IV SOLN: 250.0000 mL | INTRAVENOUS | Status: DC | PRN

## 2012-08-05 MED ORDER — MUPIROCIN 2 % EX OINT
TOPICAL_OINTMENT | Freq: Once | CUTANEOUS | Status: AC
  Administered 2012-08-05: 14:00:00 via NASAL

## 2012-08-05 MED ORDER — SODIUM CHLORIDE 0.9 % IJ SOLN: 3.0000 mL | Freq: Two times a day (BID) | INTRAMUSCULAR | Status: DC

## 2012-08-05 MED ORDER — HYDROCODONE-ACETAMINOPHEN 5-325 MG PO TABS: 1.0000 | ORAL_TABLET | ORAL | Status: DC | PRN

## 2012-08-05 MED ORDER — CEFAZOLIN SODIUM-DEXTROSE 2-3 GM-% IV SOLR
INTRAVENOUS | Status: AC
  Filled 2012-08-05: qty 50

## 2012-08-05 NOTE — H&P (Signed)
The patient presents today for pacemaker generator change.  Since last being seen in our clinic, the patient reports doing very well.  Today, he denies symptoms of palpitations, chest pain, shortness of breath, orthopnea, PND, lower extremity edema, dizziness, presyncope, syncope, or neurologic sequela.  He recently was found to have reached ERI battery status on pacemaker interrogation.  He presents today for pacemaker generator change.  Past Medical History  Diagnosis Date  . Hyperlipidemia   . Hypertension   . Congestive heart failure, unspecified   . Coronary atherosclerosis of native coronary artery   . 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 End stage renal disease     on HD MWF  . Aortoiliac occlusive disease   . Secondary hyperparathyroidism   . Gout   . MI (myocardial infarction)   . Hemodialysis patient 1999   Past Surgical History  Procedure Laterality Date  . Coronary artery bypass graft  04/18/05    Jonathan Padilla  . Ppm  11/18/05    Medtronic EnRhythm P1501-complete heart block    Current Facility-Administered Medications  Medication Dose Route Frequency Provider Last Rate Last Dose  . 0.45 % sodium chloride infusion   Intravenous Continuous Hillis Range, MD      . 0.9 %  sodium chloride infusion  250 mL Intravenous Continuous Hillis Range, MD      . ceFAZolin (ANCEF) 2-3 GM-% IVPB SOLR           . ceFAZolin (ANCEF) IVPB 2 g/50 mL premix  2 g Intravenous On Call Hillis Range, MD      . chlorhexidine (HIBICLENS) 4 % liquid 4 application  60 mL Topical Once Hillis Range, MD      . gentamicin (GARAMYCIN) 80 mg in sodium chloride irrigation 0.9 % 500 mL irrigation  80 mg Irrigation On Call Hillis Range, MD      . [COMPLETED] lidocaine (PF) (XYLOCAINE) 1 % injection           . mupirocin ointment (BACTROBAN) 2 %           . sodium chloride 0.9 % injection 3 mL  3 mL Intravenous Q12H Hillis Range, MD      . sodium chloride 0.9 % injection  3 mL  3 mL Intravenous PRN Hillis Range, MD        No Known Allergies  History   Social History  . Marital Status: Married    Spouse Name: N/A    Number of Children: N/A  . Years of Education: N/A   Occupational History  . Not on file.   Social History Main Topics  . Smoking status: Former Smoker -- 1.00 packs/day for 50 years    Types: Cigarettes    Quit date: 06/09/1995  . Smokeless tobacco: Never Used  . Alcohol Use: Not on file  . Drug Use: Not on file  . Sexually Active: Not on file   Other Topics Concern  . Not on file   Social History Narrative  . No narrative on file   Physical Exam: Filed Vitals:   08/05/12 1349  BP: 157/87  Pulse: 72  Temp: 96.9 F (36.1 C)  TempSrc: Oral  Resp: 20  Height: 5' 6.25" (1.683 m)  Weight: 154 lb (69.854 kg)  SpO2: 97%   GEN- The patient is well appearing, alert and oriented x 3 today.   Head- normocephalic, atraumatic Eyes-  Sclera clear, conjunctiva pink Ears- hearing  intact Oropharynx- clear Neck- supple,  Lungs- Clear to ausculation bilaterally, normal work of breathing Heart- Regular rate and rhythm  GI- soft, NT, ND, + BS Extremities- no clubbing, cyanosis, or edema  Device interrogation today is reviewed and confirms ERI battery status.  Assessment and Plan:  1.  Mobitz II Second degree AV block/ ERI battery status PPM is interrogated today and confirmed to be at [facility] battery status.  Risks, benefits, and alternatives to PPM generator change were discussed with the patient who wishes to proceed.

## 2012-08-05 NOTE — Op Note (Signed)
SURGEON:  Hillis Range, MD     PREPROCEDURE DIAGNOSES:   1. Mobitz II Second Degree AV block    POSTPROCEDURE DIAGNOSES:   1. Mobitz II Second Degree AV block    PROCEDURES:   1. Pacemaker pulse generator replacement.   2. Skin pocket revision.     INTRODUCTION:  Jonathan Padilla is a 79 y.o. male with a history of Mobitz II Second Degree AV block. He has done well since his pacemaker was implanted.  He has recently reached ERI battery status.  He presents today for pacemaker pulse generator replacement.       DESCRIPTION OF THE PROCEDURE:  Informed written consent was obtained, and the patient was brought to the electrophysiology lab in the fasting state.  The patient's pacemaker was interrogated today and found to be at elective replacement indicator battery status.  The patient required no sedation for the procedure today.  The patient's right chest was prepped and draped in the usual sterile fashion by the EP lab staff.  The skin overlying the existing pacemaker was infiltrated with lidocaine for local analgesia.  A 4-cm incision was made over the pacemaker pocket.  Using a combination of sharp and blunt dissection, the pacemaker was exposed and removed from the body.  The device was disconnected from the leads. A single silk suture was identified and removed which had secured the device to the pectoralis fascia.  There was no foreign matter or debris within the pocket.  The atrial lead was confirmed to be a Medtronic model [id number] (serial number [id number]) lead implanted on 11/18/05.  The right ventricular lead was confirmed to be a Medtronic model [id number] (serial number [id number]) lead implanted on the same date as the atrial lead (above).  Both leads were examined and their integrity was confirmed to be intact.  Atrial lead P-waves measured 3.2 mV with impedance of 401 ohms and a threshold of 0.5 V at 0.5 msec.  Right ventricular lead R-waves measured 10.9 mV with impedance of 451 ohms and a  threshold of 0.6 V at 0.5 msec.  Both leads were connected to a Medtronic adapt L model ADDDR 1 (serial number [id number]) pacemaker.  The pocket was revised to accommodate this new device.  Electrocautery was required to assure hemostasis.  The pocket was irrigated with copious gentamicin solution. The pacemaker was then placed into the pocket.  The pocket was then closed in 2 layers with 2-0 Vicryl suture over the subcutaneous and subcuticular layers.  Steri-Strips and a sterile dressing were then applied.  There were no early apparent complications.     CONCLUSIONS:   1. Successful pacemaker pulse generator replacement for elective replacement indicator battery status   2. No early apparent complications.     Hillis Range, MD 08/05/2012 4:13 PM

## 2012-08-05 NOTE — Telephone Encounter (Signed)
Tried to call back  Per Joice Lofts will go home today

## 2012-08-05 NOTE — Telephone Encounter (Signed)
F/u    Pt need to know if Dr Johney Frame will set up for her dad to have dialysis 08/06/12 because of his overnight stay after his surgery. Would like to speak with nurse

## 2012-08-09 ENCOUNTER — Inpatient Hospital Stay (HOSPITAL_COMMUNITY): Payer: Medicare Other

## 2012-08-09 ENCOUNTER — Encounter (HOSPITAL_COMMUNITY): Payer: Self-pay | Admitting: Vascular Surgery

## 2012-08-09 ENCOUNTER — Emergency Department (HOSPITAL_COMMUNITY): Payer: Medicare Other

## 2012-08-09 ENCOUNTER — Inpatient Hospital Stay (HOSPITAL_COMMUNITY)
Admission: EM | Admit: 2012-08-09 | Discharge: 2012-08-13 | DRG: 193 | Disposition: A | Discharge status: Home Health | Payer: Medicare Other | Attending: Internal Medicine | Admitting: Internal Medicine

## 2012-08-09 DIAGNOSIS — M109 Gout, unspecified: Secondary | ICD-10-CM | POA: Diagnosis present

## 2012-08-09 DIAGNOSIS — J4489 Other specified chronic obstructive pulmonary disease: Secondary | ICD-10-CM | POA: Diagnosis present

## 2012-08-09 DIAGNOSIS — I10 Essential (primary) hypertension: Secondary | ICD-10-CM | POA: Diagnosis present

## 2012-08-09 DIAGNOSIS — I70209 Unspecified atherosclerosis of native arteries of extremities, unspecified extremity: Secondary | ICD-10-CM | POA: Diagnosis present

## 2012-08-09 DIAGNOSIS — I509 Heart failure, unspecified: Secondary | ICD-10-CM

## 2012-08-09 DIAGNOSIS — I2510 Atherosclerotic heart disease of native coronary artery without angina pectoris: Secondary | ICD-10-CM | POA: Diagnosis present

## 2012-08-09 DIAGNOSIS — D631 Anemia in chronic kidney disease: Secondary | ICD-10-CM | POA: Diagnosis present

## 2012-08-09 DIAGNOSIS — N186 End stage renal disease: Secondary | ICD-10-CM

## 2012-08-09 DIAGNOSIS — E162 Hypoglycemia, unspecified: Secondary | ICD-10-CM | POA: Diagnosis present

## 2012-08-09 DIAGNOSIS — Z87891 Personal history of nicotine dependence: Secondary | ICD-10-CM

## 2012-08-09 DIAGNOSIS — Z992 Dependence on renal dialysis: Secondary | ICD-10-CM

## 2012-08-09 DIAGNOSIS — E785 Hyperlipidemia, unspecified: Secondary | ICD-10-CM | POA: Diagnosis present

## 2012-08-09 DIAGNOSIS — Z951 Presence of aortocoronary bypass graft: Secondary | ICD-10-CM

## 2012-08-09 DIAGNOSIS — I359 Nonrheumatic aortic valve disorder, unspecified: Secondary | ICD-10-CM | POA: Diagnosis present

## 2012-08-09 DIAGNOSIS — I252 Old myocardial infarction: Secondary | ICD-10-CM

## 2012-08-09 DIAGNOSIS — D72829 Elevated white blood cell count, unspecified: Secondary | ICD-10-CM

## 2012-08-09 DIAGNOSIS — J189 Pneumonia, unspecified organism: Principal | ICD-10-CM

## 2012-08-09 DIAGNOSIS — I441 Atrioventricular block, second degree: Secondary | ICD-10-CM | POA: Diagnosis present

## 2012-08-09 DIAGNOSIS — Z950 Presence of cardiac pacemaker: Secondary | ICD-10-CM

## 2012-08-09 DIAGNOSIS — N2581 Secondary hyperparathyroidism of renal origin: Secondary | ICD-10-CM | POA: Diagnosis present

## 2012-08-09 DIAGNOSIS — N039 Chronic nephritic syndrome with unspecified morphologic changes: Secondary | ICD-10-CM | POA: Diagnosis present

## 2012-08-09 DIAGNOSIS — Z79899 Other long term (current) drug therapy: Secondary | ICD-10-CM

## 2012-08-09 DIAGNOSIS — J9620 Acute and chronic respiratory failure, unspecified whether with hypoxia or hypercapnia: Secondary | ICD-10-CM | POA: Diagnosis present

## 2012-08-09 DIAGNOSIS — R64 Cachexia: Secondary | ICD-10-CM | POA: Diagnosis present

## 2012-08-09 DIAGNOSIS — IMO0002 Reserved for concepts with insufficient information to code with codable children: Secondary | ICD-10-CM

## 2012-08-09 DIAGNOSIS — I120 Hypertensive chronic kidney disease with stage 5 chronic kidney disease or end stage renal disease: Secondary | ICD-10-CM | POA: Diagnosis present

## 2012-08-09 DIAGNOSIS — J449 Chronic obstructive pulmonary disease, unspecified: Secondary | ICD-10-CM | POA: Diagnosis present

## 2012-08-09 HISTORY — DX: Pneumonia, unspecified organism: J18.9

## 2012-08-09 HISTORY — DX: Shortness of breath: R06.02

## 2012-08-09 LAB — CBC
MCH: 28.5 pg (ref 26.0–34.0)
Platelets: 189 10*3/uL (ref 150–400)
RBC: 3.97 MIL/uL — ABNORMAL LOW (ref 4.22–5.81)
RDW: 16 % — ABNORMAL HIGH (ref 11.5–15.5)
WBC: 17.2 10*3/uL — ABNORMAL HIGH (ref 4.0–10.5)

## 2012-08-09 LAB — CBC WITH DIFFERENTIAL/PLATELET
Basophils Absolute: 0 10*3/uL (ref 0.0–0.1)
Basophils Relative: 0 % (ref 0–1)
MCHC: 31.1 g/dL (ref 30.0–36.0)
Neutro Abs: 7.8 10*3/uL — ABNORMAL HIGH (ref 1.7–7.7)
Neutrophils Relative %: 96 % — ABNORMAL HIGH (ref 43–77)
Platelets: 190 10*3/uL (ref 150–400)
RDW: 15.9 % — ABNORMAL HIGH (ref 11.5–15.5)

## 2012-08-09 LAB — POCT I-STAT, CHEM 8
Chloride: 103 mEq/L (ref 96–112)
HCT: 38 % — ABNORMAL LOW (ref 39.0–52.0)
Hemoglobin: 12.9 g/dL — ABNORMAL LOW (ref 13.0–17.0)
Potassium: 3 mEq/L — ABNORMAL LOW (ref 3.5–5.1)

## 2012-08-09 LAB — CREATININE, SERUM: Creatinine, Ser: 9.2 mg/dL — ABNORMAL HIGH (ref 0.50–1.35)

## 2012-08-09 LAB — POCT I-STAT TROPONIN I: Troponin i, poc: 0.05 ng/mL (ref 0.00–0.08)

## 2012-08-09 MED ORDER — DARBEPOETIN ALFA-POLYSORBATE 60 MCG/0.3ML IJ SOLN
60.0000 ug | INTRAMUSCULAR | Status: DC
  Filled 2012-08-09: qty 0.3

## 2012-08-09 MED ORDER — SEVELAMER CARBONATE 800 MG PO TABS
1600.0000 mg | ORAL_TABLET | ORAL | Status: DC | PRN
  Filled 2012-08-09: qty 2

## 2012-08-09 MED ORDER — DOXERCALCIFEROL 4 MCG/2ML IV SOLN
5.0000 ug | INTRAVENOUS | Status: DC
  Administered 2012-08-09 – 2012-08-13 (×3): 5 ug via INTRAVENOUS
  Filled 2012-08-09 (×3): qty 4

## 2012-08-09 MED ORDER — ALBUTEROL SULFATE (5 MG/ML) 0.5% IN NEBU
2.5000 mg | INHALATION_SOLUTION | RESPIRATORY_TRACT | Status: DC | PRN
  Administered 2012-08-09 – 2012-08-10 (×3): 2.5 mg via RESPIRATORY_TRACT
  Filled 2012-08-09 (×2): qty 0.5

## 2012-08-09 MED ORDER — BISACODYL 5 MG PO TBEC: 5.0000 mg | DELAYED_RELEASE_TABLET | Freq: Every day | ORAL | Status: DC | PRN

## 2012-08-09 MED ORDER — LEVOFLOXACIN IN D5W 500 MG/100ML IV SOLN
500.0000 mg | INTRAVENOUS | Status: DC
  Administered 2012-08-11: 500 mg via INTRAVENOUS
  Filled 2012-08-09 (×3): qty 100

## 2012-08-09 MED ORDER — ONDANSETRON HCL 4 MG/2ML IJ SOLN: 4.0000 mg | Freq: Four times a day (QID) | INTRAMUSCULAR | Status: DC | PRN

## 2012-08-09 MED ORDER — LEVOFLOXACIN IN D5W 750 MG/150ML IV SOLN
750.0000 mg | INTRAVENOUS | Status: AC
  Administered 2012-08-09: 750 mg via INTRAVENOUS
  Filled 2012-08-09 (×2): qty 150

## 2012-08-09 MED ORDER — VANCOMYCIN HCL 1000 MG IV SOLR
750.0000 mg | INTRAVENOUS | Status: DC
  Administered 2012-08-09 – 2012-08-13 (×3): 750 mg via INTRAVENOUS
  Filled 2012-08-09 (×7): qty 750

## 2012-08-09 MED ORDER — MUPIROCIN 2 % EX OINT
1.0000 "application " | TOPICAL_OINTMENT | Freq: Two times a day (BID) | CUTANEOUS | Status: DC
  Administered 2012-08-09 – 2012-08-13 (×8): 1 via NASAL
  Filled 2012-08-09 (×3): qty 22

## 2012-08-09 MED ORDER — PIPERACILLIN-TAZOBACTAM 3.375 G IVPB
3.3750 g | Freq: Once | INTRAVENOUS | Status: AC
  Administered 2012-08-09: 3.375 g via INTRAVENOUS
  Filled 2012-08-09: qty 50

## 2012-08-09 MED ORDER — CARVEDILOL 6.25 MG PO TABS
6.2500 mg | ORAL_TABLET | Freq: Two times a day (BID) | ORAL | Status: DC
  Administered 2012-08-10 – 2012-08-13 (×5): 6.25 mg via ORAL
  Filled 2012-08-09 (×10): qty 1

## 2012-08-09 MED ORDER — ISOSORBIDE MONONITRATE ER 30 MG PO TB24
30.0000 mg | ORAL_TABLET | Freq: Every day | ORAL | Status: DC
  Administered 2012-08-09: 30 mg via ORAL
  Filled 2012-08-09 (×2): qty 1

## 2012-08-09 MED ORDER — CHLORHEXIDINE GLUCONATE CLOTH 2 % EX PADS
6.0000 | MEDICATED_PAD | Freq: Every day | CUTANEOUS | Status: DC
  Administered 2012-08-10 – 2012-08-11 (×2): 6 via TOPICAL

## 2012-08-09 MED ORDER — ALBUTEROL SULFATE (5 MG/ML) 0.5% IN NEBU
5.0000 mg | INHALATION_SOLUTION | Freq: Once | RESPIRATORY_TRACT | Status: AC
  Administered 2012-08-09: 5 mg via RESPIRATORY_TRACT
  Filled 2012-08-09: qty 1

## 2012-08-09 MED ORDER — PIPERACILLIN-TAZOBACTAM 3.375 G IVPB 30 MIN
3.3750 g | Freq: Three times a day (TID) | INTRAVENOUS | Status: DC
  Filled 2012-08-09 (×3): qty 50

## 2012-08-09 MED ORDER — PENTAFLUOROPROP-TETRAFLUOROETH EX AERO
INHALATION_SPRAY | CUTANEOUS | Status: AC
  Filled 2012-08-09: qty 103.5

## 2012-08-09 MED ORDER — ATORVASTATIN CALCIUM 40 MG PO TABS
40.0000 mg | ORAL_TABLET | Freq: Every day | ORAL | Status: DC
  Administered 2012-08-09 – 2012-08-12 (×3): 40 mg via ORAL
  Filled 2012-08-09 (×5): qty 1

## 2012-08-09 MED ORDER — DOCUSATE SODIUM 100 MG PO CAPS
100.0000 mg | ORAL_CAPSULE | Freq: Two times a day (BID) | ORAL | Status: DC
  Administered 2012-08-09 – 2012-08-13 (×7): 100 mg via ORAL
  Filled 2012-08-09 (×8): qty 1

## 2012-08-09 MED ORDER — SEVELAMER CARBONATE 800 MG PO TABS
3200.0000 mg | ORAL_TABLET | Freq: Three times a day (TID) | ORAL | Status: DC
  Administered 2012-08-09 – 2012-08-10 (×3): 3200 mg via ORAL
  Filled 2012-08-09 (×6): qty 4

## 2012-08-09 MED ORDER — RENA-VITE PO TABS
1.0000 | ORAL_TABLET | Freq: Every day | ORAL | Status: DC
  Administered 2012-08-09 – 2012-08-10 (×2): 1 via ORAL
  Filled 2012-08-09 (×4): qty 1

## 2012-08-09 MED ORDER — DARBEPOETIN ALFA-POLYSORBATE 60 MCG/0.3ML IJ SOLN
INTRAMUSCULAR | Status: AC
  Filled 2012-08-09: qty 0.3

## 2012-08-09 MED ORDER — SODIUM CHLORIDE 0.9 % IV SOLN
500.0000 mg | Freq: Once | INTRAVENOUS | Status: AC
  Administered 2012-08-09: 500 mg via INTRAVENOUS
  Filled 2012-08-09: qty 500

## 2012-08-09 MED ORDER — PANTOPRAZOLE SODIUM 40 MG PO TBEC
40.0000 mg | DELAYED_RELEASE_TABLET | Freq: Every day | ORAL | Status: DC
  Administered 2012-08-09 – 2012-08-13 (×4): 40 mg via ORAL
  Filled 2012-08-09 (×4): qty 1

## 2012-08-09 MED ORDER — ONDANSETRON HCL 4 MG PO TABS
4.0000 mg | ORAL_TABLET | Freq: Four times a day (QID) | ORAL | Status: DC | PRN
  Filled 2012-08-09: qty 0.5

## 2012-08-09 MED ORDER — SODIUM CHLORIDE 0.9 % IV BOLUS (SEPSIS): 200.0000 mL | Freq: Once | INTRAVENOUS | Status: DC

## 2012-08-09 MED ORDER — DOXERCALCIFEROL 4 MCG/2ML IV SOLN
INTRAVENOUS | Status: AC
  Filled 2012-08-09: qty 4

## 2012-08-09 MED ORDER — HEPARIN SODIUM (PORCINE) 5000 UNIT/ML IJ SOLN
5000.0000 [IU] | Freq: Three times a day (TID) | INTRAMUSCULAR | Status: DC
  Administered 2012-08-09 – 2012-08-13 (×12): 5000 [IU] via SUBCUTANEOUS
  Filled 2012-08-09 (×15): qty 1

## 2012-08-09 MED ORDER — PIPERACILLIN-TAZOBACTAM IN DEX 2-0.25 GM/50ML IV SOLN
2.2500 g | Freq: Three times a day (TID) | INTRAVENOUS | Status: DC
  Administered 2012-08-09 – 2012-08-13 (×11): 2.25 g via INTRAVENOUS
  Filled 2012-08-09 (×15): qty 50

## 2012-08-09 MED ORDER — ALBUTEROL SULFATE (5 MG/ML) 0.5% IN NEBU
INHALATION_SOLUTION | RESPIRATORY_TRACT | Status: AC
  Filled 2012-08-09: qty 0.5

## 2012-08-09 MED ORDER — ACETAMINOPHEN 325 MG PO TABS
650.0000 mg | ORAL_TABLET | Freq: Four times a day (QID) | ORAL | Status: DC | PRN
  Administered 2012-08-09 – 2012-08-12 (×4): 650 mg via ORAL
  Filled 2012-08-09 (×4): qty 2

## 2012-08-09 MED ORDER — PIPERACILLIN-TAZOBACTAM IN DEX 2-0.25 GM/50ML IV SOLN
2.2500 g | Freq: Three times a day (TID) | INTRAVENOUS | Status: DC
  Filled 2012-08-09 (×2): qty 50

## 2012-08-09 MED ORDER — VANCOMYCIN HCL IN DEXTROSE 1-5 GM/200ML-% IV SOLN
1000.0000 mg | Freq: Once | INTRAVENOUS | Status: AC
  Administered 2012-08-09: 1000 mg via INTRAVENOUS
  Filled 2012-08-09: qty 200

## 2012-08-09 MED ORDER — ALBUTEROL SULFATE (5 MG/ML) 0.5% IN NEBU
2.5000 mg | INHALATION_SOLUTION | Freq: Four times a day (QID) | RESPIRATORY_TRACT | Status: DC
  Administered 2012-08-09 – 2012-08-10 (×5): 2.5 mg via RESPIRATORY_TRACT
  Filled 2012-08-09 (×5): qty 0.5

## 2012-08-09 NOTE — Progress Notes (Signed)
PATIENT DETAILS Name: Jonathan Padilla Age: 79 y.o. Sex: male Date of Birth: [birth year] Admit Date: 08/09/2012 Admitting Physician Houston Siren, MD PCP:No primary Genevie Elman on file.  Subjective: Admitted with SOB and vomiting. Due for HD today. Currently cannot lie flat because of SOB. Just about speaking in full sentences  Assessment/Plan: Principal Problem:   HCAP (healthcare-associated pneumonia) -?Aspiration as well-given vomiting history -c/w Empiric Vancomycin and Zosyn -CXR shows impressive Right sided infiltrate -Afebrile, but significant leukocytosis this am  Active Problems: Acute on Chronic Hypoxic Respiratory Failure -Multifactorial-mostly due to PNA-likely some contributions from Fluid overload/COPD -O2 as needed -Renal consulted for HD-hopefully he will be more improved following HD  ESRD on HD -Renal consulted-regular HD days are TTS  Dyslipidemia -c/w Lipitor  HTN -controlled -c/w Coreg, Imdur  COPD -on nocturnal home O2 -not wheezing on exam-c/w Nebs for now    H/o Second degree Mobitz II AV block -has PPM in situ-battery was just recently replaced  CAD -History of myocardial infarction with subsequent coronary artery bypass graft surgery for 3-vessel coronary artery disease, November 2006. -stable at present  Aortoiliac occlusive disease - status post resection and grafting of abdominal aortic aneurysm. -stable for now-per family-patient has scheduled outpatient appointment  Disposition: Remain inpatient  DVT Prophylaxis: Prophylactic Heparin  Code Status: Full code   Procedures:  None  CONSULTS:  nephrology  PHYSICAL EXAM: Vital signs in last 24 hours: Filed Vitals:   08/09/12 0700 08/09/12 0730 08/09/12 0914 08/09/12 0936  BP: 108/44 105/51 138/63 162/67  Pulse: 85 79 90   Temp:    97.8 F (36.6 C)  TempSrc:      Resp: 25 23 20 20   Height:    5\' 6"  (1.676 m)  Weight:    70 kg (154 lb 5.2 oz)  SpO2: 96% 98% 94% 99%    Weight  change:  Body mass index is 24.92 kg/(m^2).   Gen Exam: Awake and alert with clear speech.  In mild distress-but not using accessory muscles Neck: Supple, No JVD.   Chest: B/L rales-right>left CVS: S1 S2 Regular, no murmurs.  Abdomen: soft, BS +, non tender, non distended.  Extremities: no edema, lower extremities warm to touch. Neurologic: Non Focal.   Skin: No Rash.   Wounds: N/A.    Intake/Output from previous day: No intake or output data in the 24 hours ending 08/09/12 1205   LAB RESULTS: CBC  Recent Labs Lab 08/05/12 1300 08/09/12 0441 08/09/12 0519 08/09/12 1010  WBC 12.5* 8.1  --  17.2*  HGB 11.1* 11.5* 12.9* 11.3*  HCT 34.3* 37.0* 38.0* 36.6*  PLT 470* 190  --  189  MCV 88.9 91.4  --  92.2  MCH 28.8 28.4  --  28.5  MCHC 32.4 31.1  --  30.9  RDW 15.9* 15.9*  --  16.0*  LYMPHSABS  --  0.1*  --   --   MONOABS  --  0.1  --   --   EOSABS  --  0.1  --   --   BASOSABS  --  0.0  --   --     Chemistries   Recent Labs Lab 08/05/12 1417 08/09/12 0519 08/09/12 1010  NA 137 140  --   K 3.4* 3.0*  --   CL 95* 103  --   CO2 26  --   --   GLUCOSE 97 91  --   BUN 31* 60*  --   CREATININE 5.78* 7.90* 9.20*  CALCIUM  10.2  --   --     CBG: No results found for this basename: GLUCAP,  in the last 168 hours  GFR Estimated Creatinine Clearance: 5.5 ml/min (by C-G formula based on Cr of 9.2).  Coagulation profile No results found for this basename: INR, PROTIME,  in the last 168 hours  Cardiac Enzymes No results found for this basename: CK, CKMB, TROPONINI, MYOGLOBIN,  in the last 168 hours  No components found with this basename: POCBNP,  No results found for this basename: DDIMER,  in the last 72 hours No results found for this basename: HGBA1C,  in the last 72 hours No results found for this basename: CHOL, HDL, LDLCALC, TRIG, CHOLHDL, LDLDIRECT,  in the last 72 hours No results found for this basename: TSH, T4TOTAL, FREET3, T3FREE, THYROIDAB,  in the last  72 hours No results found for this basename: VITAMINB12, FOLATE, FERRITIN, TIBC, IRON, RETICCTPCT,  in the last 72 hours No results found for this basename: LIPASE, AMYLASE,  in the last 72 hours  Urine Studies No results found for this basename: UACOL, UAPR, USPG, UPH, UTP, UGL, UKET, UBIL, UHGB, UNIT, UROB, ULEU, UEPI, UWBC, URBC, UBAC, CAST, CRYS, UCOM, BILUA,  in the last 72 hours  MICROBIOLOGY: Recent Results (from the past 240 hour(s))  SURGICAL PCR SCREEN     Status: Abnormal   Collection Time    08/05/12  1:38 PM      Result Value Range Status   MRSA, PCR POSITIVE (*) NEGATIVE Final   Staphylococcus aureus POSITIVE (*) NEGATIVE Final   Comment:            The Xpert SA Assay (FDA     approved for NASAL specimens     in patients over 72 years of age),     is one component of     a comprehensive surveillance     program.  Test performance has     been validated by [employer] for patients greater     than or equal to 59 year old.     It is not intended     to diagnose infection nor to     guide or monitor treatment.    RADIOLOGY STUDIES/RESULTS: Dg Chest 2 View  08/05/2012  *RADIOLOGY REPORT*  Clinical Data: Pacemaker generator exchange  CHEST - 2 VIEW  Comparison: The chest radiograph 03/27/2011  Findings: Right-sided pacemaker is in place with two continuous leads.  The pacing wires extend adjacent to the right subclavian stent.  There is a thoracic aortic stent graft which appears unchanged. No pneumothorax.  IMPRESSION: No complications following right pacer placement.N   Original Report Authenticated By: Genevive Bi, M.D.    Ct Abdomen Pelvis W Contrast  07/29/2012  *RADIOLOGY REPORT*  Clinical Data: Left-sided flank pain.  CT ABDOMEN AND PELVIS WITH CONTRAST  Technique:  Multidetector CT imaging of the abdomen and pelvis was performed following the standard protocol during bolus administration of intravenous contrast.  Contrast: OMNIPAQUE IOHEXOL 300 MG/ML   SOLN  Comparison: CT of the abdomen and pelvis 04/11/2005.  Findings:  Lung Bases: Scarring in the inferior aspect of the right lower and middle lobes.  Pacemaker wires in the right atrium and right ventricle.  Calcifications of the aortic valve, mitral valve and mitral annulus.  The distal end of a stent graft in the thoracic aorta is noted.  Distal thoracic aorta is aneurysmally dilated beneath the stent graft measuring up to 4.3  x 4.0 cm shortly above the level of the aortic hiatus.  Abdomen/Pelvis:  Irregularly shaped partially calcified gallstones are noted dependently within the gallbladder.  The gallbladder appears only moderately distended, however, there is diffuse gallbladder wall thickening measuring up to 11 mm.  No frank pericholecystic fluid or stranding is noted.  The enhanced appearance of the liver, pancreas, spleen and bilateral adrenal glands is unremarkable.  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  Numerous calcifications are noted in the renal hila bilaterally, favored to represent vascular calcifications (less likely to represent nonobstructive calculi).  No hydronephrosis.  A moderate sized duodenal diverticulum from the third portion of the duodenum is incidentally noted.  Postoperative changes of the aortobi-iliac bypass graft are again noted.  Importantly, between the beginning of the bypass graft and the origins of the renal arteries there is a saccular aneurysm of the infrarenal abdominal aorta extending to the left side measuring up to 5.2 x 4.4 cm (image 28 of series 2).  There is eccentric nonenhancing material within the wall of the aneurysm favored to represent mural thrombus and/or atheromatous plaque.  There is no significant volume of ascites.  No pneumoperitoneum. No pathologic  distension of small bowel.  No definite pathologic lymphadenopathy is identified within the abdomen or pelvis, although there are numerous reactive size borderline enlarged retroperitoneal lymph nodes noted.  Musculoskeletal: There are no aggressive appearing lytic or blastic lesions noted in the visualized portions of the skeleton. Prominent Schmorl's nodes in the inferior aspects of the L3-L4 vertebral bodies.  IMPRESSION: 1.  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 No current signs of active extravasation to suggest leaking aneurysm at this time. 2.  Cholelithiasis with moderate gallbladder distension and diffuse gallbladder wall thickening suggesting edema.  No overt signs of inflammation.  Overall, the examination is equivocal for acute cholecystitis.  Further evaluation with right upper quadrant ultrasound may be warranted if the patient is exhibiting signs or symptoms concerning for acute cholecystitis. 3.  Aneurysmal dilatation of the distal descending thoracic aorta below the level of the thoracic aortic stent graft, measuring up to 4.0 x 4.3 cm in diameter. 4.  Additional incidental findings, similar to prior examinations, as above.   Original Report Authenticated By: Trudie Reed, M.D.    Dg Chest Port 1 View  08/09/2012  *RADIOLOGY REPORT*  Clinical Data: Shortness of breath and cough.  PORTABLE CHEST - 1 VIEW  Comparison: Chest radiograph performed 08/05/2012  Findings: There is new right midlung airspace opacification, compatible with multifocal pneumonia.  Mild left basilar opacity is also seen.  No definite pleural effusion or pneumothorax is seen.  The cardiomediastinal silhouette is borderline enlarged.  The patient is status post median sternotomy.  A vascular stent graft is noted along the thoracic aorta.  A pacemaker is seen overlying the right  chest wall, with leads ending overlying the right atrium and right ventricle.  No acute osseous abnormalities are seen.  IMPRESSION:  1.  New right midlung and left basilar airspace opacification, compatible with multifocal pneumonia. 2.  Borderline cardiomegaly.   Original Report Authenticated By: Tonia Ghent, M.D.     MEDICATIONS: Scheduled Meds: . albuterol  2.5 mg Nebulization Q6H  . atorvastatin  40 mg Oral Daily  . carvedilol  6.25 mg Oral BID WC  . Chlorhexidine Gluconate Cloth  6 each Topical Q0600  . docusate sodium  100 mg Oral BID  . heparin  5,000 Units Subcutaneous Q8H  . isosorbide mononitrate  30 mg Oral Daily  . multivitamin  1 tablet Oral Daily  . mupirocin ointment  1 application Nasal BID  . pantoprazole  40 mg Oral Daily  . piperacillin-tazobactam (ZOSYN)  IV  2.25 g Intravenous Q8H  . sevelamer carbonate  3,200 mg Oral TID WC  . vancomycin  500 mg Intravenous Once   Continuous Infusions:  PRN Meds:.acetaminophen, albuterol, ondansetron (ZOFRAN) IV, ondansetron, sevelamer carbonate  Antibiotics: Anti-infectives   Start     Dose/Rate Route Frequency Ordered Stop   08/09/12 1400  piperacillin-tazobactam (ZOSYN) IVPB 2.25 g     2.25 g 100 mL/hr over 30 Minutes Intravenous Every 8 hours 08/09/12 1138     08/09/12 1100  piperacillin-tazobactam (ZOSYN) IVPB 2.25 g  Status:  Discontinued     2.25 g 100 mL/hr over 30 Minutes Intravenous Every 8 hours 08/09/12 1017 08/09/12 1138   08/09/12 1030  vancomycin (VANCOCIN) 500 mg in sodium chloride 0.9 % 100 mL IVPB     500 mg 100 mL/hr over 60 Minutes Intravenous  Once 08/09/12 1016     08/09/12 0945  piperacillin-tazobactam (ZOSYN) IVPB 3.375 g  Status:  Discontinued     3.375 g 100 mL/hr over 30 Minutes Intravenous 3 times per day 08/09/12 0936 08/09/12 1016   08/09/12 0530  piperacillin-tazobactam (ZOSYN) IVPB 3.375 g     3.375 g 12.5 mL/hr over 240 Minutes Intravenous  Once 08/09/12 0527 08/09/12 0634   08/09/12  0530  vancomycin (VANCOCIN) IVPB 1000 mg/200 mL premix     1,000 mg 200 mL/hr over 60 Minutes Intravenous  Once 08/09/12 0527 08/09/12 0815       Jeoffrey Massed, MD  [facility] Pager:[phone]  If 7PM-7AM, please contact night-coverage [email or web address] Password [email or web address] 08/09/2012, 12:05 PM   LOS: 0 days

## 2012-08-09 NOTE — H&P (Signed)
[facility] History and Physical  Jonathan Padilla ZOX:[id number] DOB: [birth year]    PCP:   No primary provider on file.   Chief Complaint: shortness of breath and cough.  HPI: Jonathan Padilla is an 79 y.o. male with hx of ESRD on HD (T, TH, Sat), Mobitz II s/p ppm, recent pacemaker generator replacement, HTN, CHF, PVD, CAD s/p prior MI, presents to the ER with one day hx of cough, shortness of breath and having bloody sputum.  He denied any chest pain, fever, chills, nausea, or vomiting.  He has no orthopnea or PND.  Evaluation in the ER included a CXR which showed multifocal consolidation consistent with multifocal PNA.  His K is 3.0, WBC of 12.5K, Hb of 11.5g/DL, Cr of 7.9.  Hospitalist was asked to admit him for HCAP.  Rewiew of Systems:  Constitutional: Negative for malaise, fever and chills. No significant weight loss or weight gain Eyes: Negative for eye pain, redness and discharge, diplopia, visual changes, or flashes of light. ENMT: Negative for ear pain, hoarseness, nasal congestion, sinus pressure and sore throat. No headaches; tinnitus, drooling, or problem swallowing. Cardiovascular: Negative for chest pain, palpitations, diaphoresis,and peripheral edema. ; No orthopnea, PND Respiratory: Negative for wheezing and stridor. No pleuritic chestpain. Gastrointestinal: Negative for nausea, vomiting, diarrhea, constipation, abdominal pain, melena, blood in stool, hematemesis, jaundice and rectal bleeding.    Genitourinary: Negative for frequency, dysuria, incontinence,flank pain and hematuria; Musculoskeletal: Negative for back pain and neck pain. Negative for swelling and trauma.;  Skin: . Negative for pruritus, rash, abrasions, bruising and skin lesion.; ulcerations Neuro: Negative for headache, lightheadedness and neck stiffness. Negative for weakness, altered level of consciousness , altered mental status, extremity weakness, burning feet, involuntary movement, seizure and syncope.   Psych: negative for anxiety, depression, insomnia, tearfulness, panic attacks, hallucinations, paranoia, suicidal or homicidal ideation   Past Medical History  Diagnosis Date  . Hyperlipidemia   . Hypertension   . Congestive heart failure, unspecified   . Coronary atherosclerosis of native coronary artery   . 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 End stage renal disease     on HD MWF  . Aortoiliac occlusive disease   . Secondary hyperparathyroidism   . Gout   . MI (myocardial infarction)   . Hemodialysis patient 1999    Past Surgical History  Procedure Laterality Date  . Coronary artery bypass graft  04/18/05    Rachell Cipro  . Ppm  11/18/05    Medtronic EnRhythm P1501-complete heart block    Medications:  HOME MEDS: Prior to Admission medications   Medication Sig Start Date End Date Taking? Authorizing Provider  atorvastatin (LIPITOR) 40 MG tablet Take 40 mg by mouth daily.   Yes Historical Provider, MD  carvedilol (COREG) 6.25 MG tablet Take 6.25 mg by mouth 2 (two) times daily with a meal.   Yes Historical Provider, MD  colchicine 0.6 MG tablet Take 0.6 mg by mouth daily as needed.   Yes Historical Provider, MD  isosorbide mononitrate (IMDUR) 30 MG 24 hr tablet Take 30 mg by mouth daily.   Yes Historical Provider, MD  multivitamin (RENA-VIT) TABS tablet Take 1 tablet by mouth daily.   Yes Historical Provider, MD  pantoprazole (PROTONIX) 40 MG tablet Take 40 mg by mouth daily.   Yes Historical Provider, MD  sevelamer (RENAGEL) 800 MG tablet Take 800 mg by mouth as directed. 4 with meals and 2 with snacks  Yes Historical Provider, MD     Allergies:  No Known Allergies  Social History:   reports that he quit smoking about 17 years ago. His smoking use included Cigarettes. He has a 50 pack-year smoking history. He has never used smokeless tobacco. His alcohol and drug histories are not on file.  Family History: No family history on  file.   Physical Exam: Filed Vitals:   08/09/12 0445 08/09/12 0500 08/09/12 0515 08/09/12 0545  BP: 145/61 129/63 111/53 133/57  Pulse: 94 92 89 93  Temp:      TempSrc:      Resp: 28 30 34 32  SpO2: 91% 91% 93% 90%   Blood pressure 133/57, pulse 93, temperature 98.6 F (37 C), temperature source Oral, resp. rate 32, SpO2 90.00%.  GEN:  Pleasant patient lying in the stretcher in no acute distress; cooperative with exam. PSYCH:  alert and oriented x4; does not appear anxious or depressed; affect is appropriate. HEENT: Mucous membranes pink and anicteric; PERRLA; EOM intact; no cervical lymphadenopathy nor thyromegaly or carotid bruit; no JVD; There were no stridor. Neck is very supple. Breasts:: Not examined CHEST WALL: No tenderness, ppm CHEST: Normal respiration, No rales, no wheezing, but scattered rhonchi. HEART: Regular rate and rhythm.  There are no murmur, rub, or gallops.   BACK: No kyphosis or scoliosis; no CVA tenderness ABDOMEN: soft and non-tender; no masses, no organomegaly, normal abdominal bowel sounds; no pannus; no intertriginous candida. There is no rebound and no distention. Rectal Exam: Not done EXTREMITIES: No bone or joint deformity; age-appropriate arthropathy of the hands and knees; no edema; no ulcerations.  There is no calf tenderness.  He has an AV fistula on his right upper arm. Genitalia: not examined PULSES: 2+ and symmetric SKIN: Normal hydration no rash or ulceration CNS: Cranial nerves 2-12 grossly intact no focal lateralizing neurologic deficit.  Speech is fluent; uvula elevated with phonation, facial symmetry and tongue midline. DTR are normal bilaterally, cerebella exam is intact, barbinski is negative and strengths are equaled bilaterally.  No sensory loss.   Labs on Admission:  Basic Metabolic Panel:  Recent Labs Lab 08/05/12 1417 08/09/12 0519  NA 137 140  K 3.4* 3.0*  CL 95* 103  CO2 26  --   GLUCOSE 97 91  BUN 31* 60*  CREATININE  5.78* 7.90*  CALCIUM 10.2  --    Liver Function Tests: No results found for this basename: AST, ALT, ALKPHOS, BILITOT, PROT, ALBUMIN,  in the last 168 hours No results found for this basename: LIPASE, AMYLASE,  in the last 168 hours No results found for this basename: AMMONIA,  in the last 168 hours CBC:  Recent Labs Lab 08/05/12 1300 08/09/12 0441 08/09/12 0519  WBC 12.5* 8.1  --   NEUTROABS  --  7.8*  --   HGB 11.1* 11.5* 12.9*  HCT 34.3* 37.0* 38.0*  MCV 88.9 91.4  --   PLT 470* 190  --    Cardiac Enzymes: No results found for this basename: CKTOTAL, CKMB, CKMBINDEX, TROPONINI,  in the last 168 hours  CBG: No results found for this basename: GLUCAP,  in the last 168 hours   Radiological Exams on Admission: Dg Chest Port 1 View  08/09/2012  *RADIOLOGY REPORT*  Clinical Data: Shortness of breath and cough.  PORTABLE CHEST - 1 VIEW  Comparison: Chest radiograph performed 08/05/2012  Findings: There is new right midlung airspace opacification, compatible with multifocal pneumonia.  Mild left basilar opacity is also  seen.  No definite pleural effusion or pneumothorax is seen.  The cardiomediastinal silhouette is borderline enlarged.  The patient is status post median sternotomy.  A vascular stent graft is noted along the thoracic aorta.  A pacemaker is seen overlying the right chest wall, with leads ending overlying the right atrium and right ventricle.  No acute osseous abnormalities are seen.  IMPRESSION:  1.  New right midlung and left basilar airspace opacification, compatible with multifocal pneumonia. 2.  Borderline cardiomegaly.   Original Report Authenticated By: Tonia Ghent, M.D.     EKG: NSR with 1 d AVB, IVCD.  No acute ST-T changes.  Assessment/Plan Present on Admission:  . Congestive heart failure, unspecified . HYPERTENSION, UNSPECIFIED . AORTIC INSUFFICIENCY . Second degree Mobitz II AV block . ESRD (end stage renal disease) on dialysis HCAP ++++  PLAN:  Will  admit him for HCAP.  Will treat with IV Van/Zosyn.  His K is a little low and it can be repleted with dialysis.  Please consult renal for his routine dialysis today.  I have continued his home meds.  He will receive neb treatment and supplemental oxygen. His other problems are stable.  He is stable, full code, and will be admitted to [facility] service.  Thank you for allowing me to partake in the care of this nice patient.  Other plans as per orders.  Code Status: FULL Unk Lightning, MD. [facility] Pager [phone] 7pm to 7am.  08/09/2012, 5:57 AM

## 2012-08-09 NOTE — Progress Notes (Signed)
BP was 92/37, re-checked was 78/37 & 75/34, RN re-checked manually 80/38.  Dr. Briant Cedar notified and orders to give 200 bolus.  Dr. Jerral Ralph also informed of low BP.  Will continue to monitor and carry out orders.  Forbes Cellar, RN

## 2012-08-09 NOTE — Progress Notes (Signed)
Called by RN because patient with low BP in the 70's.  Upon arrival to patients room, patient sitting in chair awake and alert.  Spoke with patient, patient states he wants to go to HD, explained to him about the reading of his low BP measurement.  Patient states they usually use his thigh to get his BP no his arms.  BP checked on left thigh 103/45.  Dr. Jerral Ralph at bedside.  Renal Md updated.  RN to call if assistance needed.

## 2012-08-09 NOTE — Progress Notes (Signed)
Jonathan Padilla [id number] Admitted to [id number]: 08/09/2012 12:31 PM Attending Provider: Dr. Trinidad Curet [name] is a 79 y.o. male patient admitted from ED awake, alert  & orientated  X 3,  Full Code, VSS - Blood pressure 162/67, pulse 90, temperature 97.8 F (36.6 C), temperature source Oral, resp. rate 20, height 5\' 6"  (1.676 m), weight 70 kg (154 lb 5.2 oz), SpO2 99.00%., O2  5 L nasal cannular, with c/o shortness of breath, no c/o chest pain, resp. Called for breathing.   IV site WDL:  hand left, condition patent and no redness with a transparent dsg that's clean dry and intact.  Allergies:  No Known Allergies   Past Medical History  Diagnosis Date  . Hyperlipidemia   . Hypertension   . Congestive heart failure, unspecified   . Coronary atherosclerosis of native coronary artery   . 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 End stage renal disease     on HD MWF  . Aortoiliac occlusive disease   . Secondary hyperparathyroidism   . Gout   . MI (myocardial infarction)   . Hemodialysis patient 1999  . Shortness of breath   . Pneumonia 08/09/2012    History:  obtained from patient & family by admission nurse.  Pt orientation to unit, room and routine. Information packet given to patient/family and safety video watched.  Admission INP armband ID verified with patient/family, and in place. SR up x 2, fall risk assessment complete with Patient and family verbalizing understanding of risks associated with falls.  Yellow arm band and red socks applied.  Pt verbalizes an understanding of how to use the call bell and to call for help before getting out of bed.  Skin, clean-dry- intact without evidence of bruising, or skin tears, old dried bloody steri strips to right upper chest from pacemaker insertion. No evidence of skin break down noted on exam.    Will cont to monitor and assist as needed.  Joana Reamer, RN 08/09/2012 12:31 PM

## 2012-08-09 NOTE — Progress Notes (Addendum)
Hemodialysis-See Flowsheet Pt reported SOB after 1 hour treatment. Placed on NRB, sats 100%. Respiratory paged, gave nebulizer. Symptoms worsening, pt states he is "getting worse". Rapid response called, Dr. Danny Lawless paged, will evaluate pt at bedside. Pt sitting up on side of bed with feet on floor. Continue to monitor. RN states he had same kind of episode this am. Dr. Eliott Nine notified as well.  Note edited in error, written by Margarita Grizzle RN

## 2012-08-09 NOTE — Plan of Care (Signed)
Problem: Phase I Progression Outcomes Goal: Initial discharge plan identified Outcome: Completed/Met Date Met:  08/09/12 To return home with daughter Goal: Voiding-avoid urinary catheter unless indicated Outcome: Completed/Met Date Met:  08/09/12 Anuric - HD pt. Goal: Other Phase I Outcomes/Goals HD

## 2012-08-09 NOTE — Progress Notes (Signed)
Pt. Stated that they always check BP in thigh.  Re-checked VSS - Blood pressure 103/45, pulse 92, temperature 98 F (36.7 C), temperature source Oral, resp. rate 20, height 5\' 6"  (1.676 m), weight 70 kg (154 lb 5.2 oz), SpO2 98.00%., O2   @ 5l. Dr. Briant Cedar called back and informed of new BP and to see if still wanted me to give bolus.  Dr. Briant Cedar to call pt. HD center and will call RN back.  Will continue to monitor and wait for return call.  Forbes Cellar, RN

## 2012-08-09 NOTE — Consult Note (Signed)
Jonathan Padilla is an 79 y.o. male referred by Dr Conley Rolls   Chief Complaint: ESRD,, anemia, Sec HPTH HPI: 79 yo BM admitted yest for PNA.  He has ESRD and is on HD in [place] on [place] schedule with last HD being SAT.  DW 72.5 KG,  EPO 5500 units, hectorol  Past Medical History  Diagnosis Date  . Hyperlipidemia   . Hypertension   . Congestive heart failure, unspecified   . Coronary atherosclerosis of native coronary artery   . 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 End stage renal disease     on HD MWF  . Aortoiliac occlusive disease   . Secondary hyperparathyroidism   . Gout   . MI (myocardial infarction)   . Hemodialysis patient 1999  . Shortness of breath   . Pneumonia 08/09/2012    Past Surgical History  Procedure Laterality Date  . Coronary artery bypass graft  04/18/05    Rachell Cipro  . Ppm  11/18/05    Medtronic EnRhythm P1501-complete heart block  . Dialysis fistula creation      History reviewed. No pertinent family history. Social History:  reports that he quit smoking about 17 years ago. His smoking use included Cigarettes. He has a 50 pack-year smoking history. He has never used smokeless tobacco. He reports that he does not drink alcohol or use illicit drugs.  Allergies: No Known Allergies  Medications Prior to Admission  Medication Sig Dispense Refill  . atorvastatin (LIPITOR) 40 MG tablet Take 40 mg by mouth daily.      . carvedilol (COREG) 6.25 MG tablet Take 6.25 mg by mouth 2 (two) times daily with a meal.      . colchicine 0.6 MG tablet Take 0.6 mg by mouth daily as needed.      . isosorbide mononitrate (IMDUR) 30 MG 24 hr tablet Take 30 mg by mouth daily.      . multivitamin (RENA-VIT) TABS tablet Take 1 tablet by mouth daily.      . pantoprazole (PROTONIX) 40 MG tablet Take 40 mg by mouth daily.      . sevelamer (RENAGEL) 800 MG tablet Take 800 mg by mouth as directed. 4 with meals and 2 with snacks         Lab  Results: UA: NA   Recent Labs  08/09/12 0441 08/09/12 0519 08/09/12 1010  WBC 8.1  --  17.2*  HGB 11.5* 12.9* 11.3*  HCT 37.0* 38.0* 36.6*  PLT 190  --  189   BMET  Recent Labs  08/09/12 0519 08/09/12 1010  NA 140  --   K 3.0*  --   CL 103  --   GLUCOSE 91  --   BUN 60*  --   CREATININE 7.90* 9.20*   LFT No results found for this basename: PROT, ALBUMIN, AST, ALT, ALKPHOS, BILITOT, BILIDIR, IBILI,  in the last 72 hours Dg Chest Port 1 View  08/09/2012  *RADIOLOGY REPORT*  Clinical Data: Shortness of breath and cough.  PORTABLE CHEST - 1 VIEW  Comparison: Chest radiograph performed 08/05/2012  Findings: There is new right midlung airspace opacification, compatible with multifocal pneumonia.  Mild left basilar opacity is also seen.  No definite pleural effusion or pneumothorax is seen.  The cardiomediastinal silhouette is borderline enlarged.  The patient is status post median sternotomy.  A vascular stent graft is noted along the thoracic aorta.  A pacemaker is seen overlying  the right chest wall, with leads ending overlying the right atrium and right ventricle.  No acute osseous abnormalities are seen.  IMPRESSION:  1.  New right midlung and left basilar airspace opacification, compatible with multifocal pneumonia. 2.  Borderline cardiomegaly.   Original Report Authenticated By: Tonia Ghent, M.D.     ROS: No change in vision + SOB no CP No abd pain but + constipation No new neuropathic CO No new arthritic CO No dysuria   PHYSICAL EXAM: Blood pressure 162/67, pulse 90, temperature 97.8 F (36.6 C), temperature source Oral, resp. rate 20, height 5\' 6"  (1.676 m), weight 70 kg (154 lb 5.2 oz), SpO2 99.00%. HEENT: PERRLA, EOMI  Nasal cannula in place NECK:No JVD LUNGS:Bil rhonchi R>L with crackles on Rt CARDIAC:RRR ABD:+BS NTND no HSM EXT:no edema.  Rt forearm AVG + bruit NEURO:CNI Ox3 no asterixis  Assessment: 1. PNA 2. Anemia 3. Sec Hpth 4. Hx  HTN 5. ESRD PLAN: 1. HD today 2.dulcolax prn 3. Cont renvela and check PO4 level 4. Resume EPO 5. Resume hectorol  Addisyn Leclaire T 08/09/2012, 1:04 PM

## 2012-08-09 NOTE — Progress Notes (Signed)
ANTIBIOTIC CONSULT NOTE - INITIAL  Pharmacy Consult for levofloxacin Indication: pneumonia  No Known Allergies  Patient Measurements: Height: 5\' 6"  (167.6 cm) Weight: 159 lb 5.2 oz (72.27 kg) IBW/kg (Calculated) : 63.8  Vital Signs: Temp: 98.3 F (36.8 C) (03/04 1636) Temp src: Oral (03/04 1636) BP: 116/89 mmHg (03/04 1900) Pulse Rate: 62 (03/04 1900) Intake/Output from previous day:   Intake/Output from this shift:    Labs:  Recent Labs  08/09/12 0441 08/09/12 0519 08/09/12 1010  WBC 8.1  --  17.2*  HGB 11.5* 12.9* 11.3*  PLT 190  --  189  CREATININE  --  7.90* 9.20*   Estimated Creatinine Clearance: 5.5 ml/min (by C-G formula based on Cr of 9.2). No results found for this basename: VANCOTROUGH, VANCOPEAK, VANCORANDOM, GENTTROUGH, GENTPEAK, GENTRANDOM, TOBRATROUGH, TOBRAPEAK, TOBRARND, AMIKACINPEAK, AMIKACINTROU, AMIKACIN,  in the last 72 hours   Microbiology: No results found for this or any previous visit (from the past 720 hour(s)).  Medical History: Past Medical History  Diagnosis Date  . Hyperlipidemia   . Hypertension   . Congestive heart failure, unspecified   . Coronary atherosclerosis of native coronary artery   . 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 End stage renal disease     on HD MWF  . Aortoiliac occlusive disease   . Secondary hyperparathyroidism   . Gout   . MI (myocardial infarction)   . Hemodialysis patient 1999  . Shortness of breath   . Pneumonia 08/09/2012    Medications:  Anti-infectives   Start     Dose/Rate Route Frequency Ordered Stop   08/11/12 2000  levofloxacin (LEVAQUIN) IVPB 500 mg     500 mg 100 mL/hr over 60 Minutes Intravenous Every 48 hours 08/09/12 1948     08/09/12 2000  levofloxacin (LEVAQUIN) IVPB 750 mg     750 mg 100 mL/hr over 90 Minutes Intravenous NOW 08/09/12 1948 08/10/12 2000   08/09/12 1400  piperacillin-tazobactam (ZOSYN) IVPB 2.25 g     2.25 g 100 mL/hr over 30 Minutes  Intravenous Every 8 hours 08/09/12 1138     08/09/12 1400  vancomycin (VANCOCIN) 750 mg in sodium chloride 0.9 % 150 mL IVPB     750 mg 150 mL/hr over 60 Minutes Intravenous Every T-Th-Sa (Hemodialysis) 08/09/12 1317     08/09/12 1100  piperacillin-tazobactam (ZOSYN) IVPB 2.25 g  Status:  Discontinued     2.25 g 100 mL/hr over 30 Minutes Intravenous Every 8 hours 08/09/12 1017 08/09/12 1138   08/09/12 1030  vancomycin (VANCOCIN) 500 mg in sodium chloride 0.9 % 100 mL IVPB     500 mg 100 mL/hr over 60 Minutes Intravenous  Once 08/09/12 1016 08/09/12 1307   08/09/12 0945  piperacillin-tazobactam (ZOSYN) IVPB 3.375 g  Status:  Discontinued     3.375 g 100 mL/hr over 30 Minutes Intravenous 3 times per day 08/09/12 0936 08/09/12 1016   08/09/12 0530  piperacillin-tazobactam (ZOSYN) IVPB 3.375 g     3.375 g 12.5 mL/hr over 240 Minutes Intravenous  Once 08/09/12 0527 08/09/12 0634   08/09/12 0530  vancomycin (VANCOCIN) IVPB 1000 mg/200 mL premix     1,000 mg 200 mL/hr over 60 Minutes Intravenous  Once 08/09/12 0527 08/09/12 0815     Assessment: 85 yom previously started on vancomycin + zosyn for pneumonia to also begin levaquin. Pt is ESRD on HD and had increasing SOB/WOB during HD tonight, will transfer to SDU.  Goal of Therapy:  Eradication of infection  Plan:  1. Levaquin 750mg  IV now then 500mg  IV Q48H 2. F/u HD plans, C&S, clinical status  Rumbarger, Drake Leach 08/09/2012,7:49 PM

## 2012-08-09 NOTE — Progress Notes (Signed)
Informed by RN-patient with SOB-better with patient sitting up. Chest-on exam-decreased air entry at both bases-but otherwise clear.This was exactly how the patient was this am as well-in mild resp distress. Was planning to repeat CXR after HD-but with SOB will go ahead and get it done. Transfer to SDU, depending on CXR results will touch base with PCCM.

## 2012-08-09 NOTE — ED Notes (Signed)
Pt reports to the ED for eval of SOB that began around 230 am. Pt woke up with SOB, cough, and hemoptysis. Pts initial O2 sats were 73%. Pt was very labored, using accessory muscles, and tachypnic. Pt is a dialysis pt is supposed to go to dialysis today. Pt had new pacemaker placed on Friday. Pt has rales throughout per EMS. Pt was only 80s on nasal cannula and was placed on NRB and O2 sats came up to the 90s. Pt was hypertensive en route and received 2 nitros for this. Pt reports the nitro helped his breathing. He only reports CP with coughing. Pt is A&Ox4. Per EMS pt having intermittent PVCs en route.

## 2012-08-09 NOTE — Progress Notes (Signed)
Called by HD RN for patient with SOB and increased WOB.  RT gave breathing treatment with no relief. Upon my arrival to bedside, patient on NRM and using accessory muscles.  Patient states breathing is worse than this am.  Breath Sounds crackles, 100% on NRM, HR 100.  BP stable.  Patient sitting on side of bed in tripod position.  Dr. Danny Lawless updated and at bedside.  PCXR ordered and transfer to SDU.  RN to call if assistance needed

## 2012-08-09 NOTE — ED Provider Notes (Signed)
History     CSN: [id number]  Arrival date & time 08/09/12  0413   First MD Initiated Contact with Patient 08/09/12 [phone]      Chief Complaint  Patient presents with  . Shortness of Breath    (Consider location/radiation/quality/duration/timing/severity/associated sxs/prior treatment) Patient is a 79 y.o. male presenting with shortness of breath. The history is provided by the patient.  Shortness of Breath Associated symptoms: cough   Associated symptoms: no abdominal pain, no chest pain, no headaches, no rash and no vomiting   EMS called the patient's house for hemoptysis that began this evening.he was found to be hypoxic with sat of 73%. He was started on oxygen. He is not be hypertensive at 220 systolic. He was given 2 sublingual nitroglycerin. His pressure then normalized. His breathing improved on oxygen. No chest pain. He's had a recent placement of pacemaker. He has a history of dialysis and is due for dialysis this morning. No chest pain. No fevers. He has been coughing with some mild blood production.  Past Medical History  Diagnosis Date  . Hyperlipidemia   . Hypertension   . Congestive heart failure, unspecified   . Coronary atherosclerosis of native coronary artery   . 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 End stage renal disease     on HD MWF  . Aortoiliac occlusive disease   . Secondary hyperparathyroidism   . Gout   . MI (myocardial infarction)   . Hemodialysis patient 1999    Past Surgical History  Procedure Laterality Date  . Coronary artery bypass graft  04/18/05    Rachell Cipro  . Ppm  11/18/05    Medtronic EnRhythm P1501-complete heart block    No family history on file.  History  Substance Use Topics  . Smoking status: Former Smoker -- 1.00 packs/day for 50 years    Types: Cigarettes    Quit date: 06/09/1995  . Smokeless tobacco: Never Used  . Alcohol Use: Not on file      Review of Systems  Constitutional:  Negative for activity change and appetite change.  HENT: Negative for neck stiffness.   Eyes: Negative for pain.  Respiratory: Positive for cough and shortness of breath. Negative for chest tightness.   Cardiovascular: Negative for chest pain and leg swelling.  Gastrointestinal: Negative for nausea, vomiting, abdominal pain and diarrhea.  Genitourinary: Negative for flank pain.  Musculoskeletal: Negative for back pain.  Skin: Negative for rash.  Neurological: Negative for weakness, numbness and headaches.  Hematological: Does not bruise/bleed easily.  Psychiatric/Behavioral: Negative for behavioral problems.    Allergies  Review of patient's allergies indicates no known allergies.  Home Medications   Current Outpatient Rx  Name  Route  Sig  Dispense  Refill  . atorvastatin (LIPITOR) 40 MG tablet   Oral   Take 40 mg by mouth daily.         . carvedilol (COREG) 6.25 MG tablet   Oral   Take 6.25 mg by mouth 2 (two) times daily with a meal.         . colchicine 0.6 MG tablet   Oral   Take 0.6 mg by mouth daily as needed.         . isosorbide mononitrate (IMDUR) 30 MG 24 hr tablet   Oral   Take 30 mg by mouth daily.         . multivitamin (RENA-VIT) TABS tablet   Oral  Take 1 tablet by mouth daily.         . pantoprazole (PROTONIX) 40 MG tablet   Oral   Take 40 mg by mouth daily.         . sevelamer (RENAGEL) 800 MG tablet   Oral   Take 800 mg by mouth as directed. 4 with meals and 2 with snacks           BP 133/57  Pulse 93  Temp(Src) 98.6 F (37 C) (Oral)  Resp 32  SpO2 90%  Physical Exam  Constitutional: He is oriented to person, place, and time. He appears well-developed and well-nourished.  HENT:  Head: Normocephalic.  Neck: Normal range of motion.  Cardiovascular: Normal rate and regular rhythm.   Pulmonary/Chest: Effort normal.  Mild tachypnea. Diffuse rails. Mild respiratory distress.Steri-Strips over pacemaker right chest wall.   Abdominal: There is no tenderness. There is no rebound.  Musculoskeletal: Normal range of motion.  Neurological: He is alert and oriented to person, place, and time.  Skin: Skin is warm.    ED Course  Procedures (including critical care time)  Labs Reviewed  CBC WITH DIFFERENTIAL - Abnormal; Notable for the following:    RBC 4.05 (*)    Hemoglobin 11.5 (*)    HCT 37.0 (*)    RDW 15.9 (*)    Neutrophils Relative 96 (*)    Neutro Abs 7.8 (*)    Lymphocytes Relative 2 (*)    Lymphs Abs 0.1 (*)    Monocytes Relative 2 (*)    All other components within normal limits  POCT I-STAT, CHEM 8 - Abnormal; Notable for the following:    Potassium 3.0 (*)    BUN 60 (*)    Creatinine, Ser 7.90 (*)    Calcium, Ion 1.10 (*)    Hemoglobin 12.9 (*)    HCT 38.0 (*)    All other components within normal limits  POCT I-STAT TROPONIN I   Dg Chest Port 1 View  08/09/2012  *RADIOLOGY REPORT*  Clinical Data: Shortness of breath and cough.  PORTABLE CHEST - 1 VIEW  Comparison: Chest radiograph performed 08/05/2012  Findings: There is new right midlung airspace opacification, compatible with multifocal pneumonia.  Mild left basilar opacity is also seen.  No definite pleural effusion or pneumothorax is seen.  The cardiomediastinal silhouette is borderline enlarged.  The patient is status post median sternotomy.  A vascular stent graft is noted along the thoracic aorta.  A pacemaker is seen overlying the right chest wall, with leads ending overlying the right atrium and right ventricle.  No acute osseous abnormalities are seen.  IMPRESSION:  1.  New right midlung and left basilar airspace opacification, compatible with multifocal pneumonia. 2.  Borderline cardiomegaly.   Original Report Authenticated By: Tonia Ghent, M.D.      1. HCAP (healthcare-associated pneumonia)   2. End stage renal disease on dialysis   3. Congestive heart failure, unspecified   4. ESRD (end stage renal disease) on dialysis       Date: 08/09/2012  Rate: 93  Rhythm: normal sinus rhythm  QRS Axis: normal  Intervals: normal  ST/T Wave abnormalities: nonspecific T wave changes  Conduction Disutrbances:nonspecific intraventricular conduction delay  Narrative Interpretation: last EKG was paced  Old EKG Reviewed: changes noted    MDM  Patient presents with SOb and hemoptysis. Pneumonia on CXR. Had room air sats of 73%. Improved after treatment. Will be admitted to medicine. Recent admission and on dialysis so will  treat as HCAP.        Juliet Rude. Rubin Payor, MD 08/09/12 0600

## 2012-08-09 NOTE — Progress Notes (Signed)
ANTIBIOTIC CONSULT NOTE - INITIAL  Pharmacy Consult for Vancomycin and Zosyn  Indication: rule out pneumonia  No Known Allergies  Patient Measurements: Height: 5\' 6"  (167.6 cm) Weight: 154 lb 5.2 oz (70 kg) IBW/kg (Calculated) : 63.8  Vital Signs: Temp: 97.8 F (36.6 C) (03/04 0936) Temp src: Oral (03/04 0422) BP: 162/67 mmHg (03/04 0936) Pulse Rate: 90 (03/04 0914)  Labs:  Recent Labs  08/09/12 0441 08/09/12 0519  WBC 8.1  --   HGB 11.5* 12.9*  PLT 190  --   CREATININE  --  7.90*   Estimated Creatinine Clearance: 6.4 ml/min (by C-G formula based on Cr of 7.9). No results found for this basename: VANCOTROUGH, VANCOPEAK, VANCORANDOM, GENTTROUGH, GENTPEAK, GENTRANDOM, TOBRATROUGH, TOBRAPEAK, TOBRARND, AMIKACINPEAK, AMIKACINTROU, AMIKACIN,  in the last 72 hours   Microbiology: No results found for this or any previous visit (from the past 720 hour(s)).  Medical History: Past Medical History  Diagnosis Date  . Hyperlipidemia   . Hypertension   . Congestive heart failure, unspecified   . Coronary atherosclerosis of native coronary artery   . 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 End stage renal disease     on HD MWF  . Aortoiliac occlusive disease   . Secondary hyperparathyroidism   . Gout   . MI (myocardial infarction)   . Hemodialysis patient 1999    Medications:  Scheduled:  . albuterol  2.5 mg Nebulization Q6H  . [COMPLETED] albuterol  5 mg Nebulization Once  . atorvastatin  40 mg Oral Daily  . carvedilol  6.25 mg Oral BID WC  . Chlorhexidine Gluconate Cloth  6 each Topical Q0600  . docusate sodium  100 mg Oral BID  . heparin  5,000 Units Subcutaneous Q8H  . isosorbide mononitrate  30 mg Oral Daily  . multivitamin  1 tablet Oral Daily  . mupirocin ointment  1 application Nasal BID  . pantoprazole  40 mg Oral Daily  . [COMPLETED] piperacillin-tazobactam (ZOSYN)  IV  3.375 g Intravenous Once  . piperacillin-tazobactam  3.375 g  Intravenous Q8H  . sevelamer carbonate  800 mg Oral TID WC  . [COMPLETED] vancomycin  1,000 mg Intravenous Once   Assessment: 79 yo male presented to the ER and is to start vancomycin and zosyn for empiric coverage of PNA. Patient is ESRD and on dialysis, HD schedule unclear at this time. Patient has already received 1000mg  vancomycin today.   WBC 8.1 and afebrile. Creatinine 7.9 and estimated CrCl ~6.15ml/min.   Goal of Therapy:  Pre-HD 15-64mcg/mL  Post-HD 5-20mcg/mL   Plan:  Vancomycin 500mg  IV today  Vancomycin 750mg  after HD  Follow-up hemodialysis schedule  Zosyn 2.25g  IV q8h  Follow-up cultures   Micheline Chapman PharmD Candidate  08/09/2012,9:57 AM

## 2012-08-09 NOTE — Procedures (Signed)
Pt seen on HD AP 180 VP 190.  BP lowish but pt says it runs low and BP has to be taken in leg.  He is mentating fine.  Try for 2 liters if mental status is stable.

## 2012-08-09 NOTE — ED Notes (Addendum)
Slight swelling noted at initial IV site. Medication stopped and IV restarted. Medication started through hand IV.

## 2012-08-09 NOTE — Care Management Note (Unsigned)
    Page 1 of 1   08/09/2012     3:26:58 PM   CARE MANAGEMENT NOTE 08/09/2012  Patient:  Jonathan Padilla, Jonathan Padilla   Account Number:  [id number]  Date Initiated:  08/09/2012  Documentation initiated by:  Letha Cape  Subjective/Objective Assessment:   dx pna, resp distress  admit     Action/Plan:   Anticipated DC Date:  08/11/2012   Anticipated DC Plan:  HOME W HOME HEALTH SERVICES      DC Planning Services  CM consult      Choice offered to / List presented to:             Status of service:  In process, will continue to follow Medicare Important Message given?   (If response is "NO", the following Medicare IM given date fields will be blank) Date Medicare IM given:   Date Additional Medicare IM given:    Discharge Disposition:    Per UR Regulation:  Reviewed for med. necessity/level of care/duration of stay  If discussed at Long Length of Stay Meetings, dates discussed:    Comments:  08/09/12 15:22 Letha Cape RN, BSN [phone] patient lives with spouse.  Patient is a HD patient, patient was in resp distress and needed emergent HD. Patient will need pt eval but not yet secondary to resp distress.  NCM will continue to follow for dc needs.

## 2012-08-10 DIAGNOSIS — D72829 Elevated white blood cell count, unspecified: Secondary | ICD-10-CM

## 2012-08-10 DIAGNOSIS — J9620 Acute and chronic respiratory failure, unspecified whether with hypoxia or hypercapnia: Secondary | ICD-10-CM

## 2012-08-10 LAB — RENAL FUNCTION PANEL
Albumin: 3.3 g/dL — ABNORMAL LOW (ref 3.5–5.2)
BUN: 45 mg/dL — ABNORMAL HIGH (ref 6–23)
Calcium: 9.8 mg/dL (ref 8.4–10.5)
Creatinine, Ser: 6.58 mg/dL — ABNORMAL HIGH (ref 0.50–1.35)
Phosphorus: 3.6 mg/dL (ref 2.3–4.6)

## 2012-08-10 LAB — CBC
HCT: 30.8 % — ABNORMAL LOW (ref 39.0–52.0)
MCHC: 32.5 g/dL (ref 30.0–36.0)
MCV: 88.3 fL (ref 78.0–100.0)
RDW: 16.2 % — ABNORMAL HIGH (ref 11.5–15.5)

## 2012-08-10 LAB — GLUCOSE, CAPILLARY
Glucose-Capillary: 91 mg/dL (ref 70–99)
Glucose-Capillary: 115 mg/dL — ABNORMAL HIGH (ref 70–99)

## 2012-08-10 MED ORDER — SEVELAMER CARBONATE 800 MG PO TABS
1600.0000 mg | ORAL_TABLET | Freq: Three times a day (TID) | ORAL | Status: DC
  Administered 2012-08-10 – 2012-08-12 (×3): 1600 mg via ORAL
  Filled 2012-08-10 (×8): qty 2

## 2012-08-10 NOTE — Progress Notes (Addendum)
[facility] PROGRESS NOTE  Jonathan Padilla ZOX:[id number] DOB: [id number] DOA: 08/09/2012 PCP: No primary provider on file.  Assessment/Plan:  1-HCAP (healthcare-associated pneumonia)  -Continue with  Vancomycin, levaquin and Zosyn day 2. -WBC elevated. Afebrile. -Will ask speech for swallow evaluation.  2-Acute on Chronic Hypoxic Respiratory Failure  -Multifactorial:  PNA, Pulmonary edema/COPD  -O2 as needed  -Had dialysis 3-4. -Improved, now on 2 L oxygen.  3-ESRD on HD  -Renal following.  -Had dialysis 3-4. 4-Dyslipidemia  -c/w Lipitor  5-HTN  Episode of hypotension. Will hold Imdur. Holder parameters for Coreg. Monitor for sepsis.   6-COPD   Nebs treatments. 7-H/o Second degree Mobitz II AV block  -has PPM in situ-battery was just recently replaced   8-Aortoiliac occlusive disease   has scheduled outpatient appointment 9-Hypoglycemia: Hypoglycemia protocol. Encourage oral intake. Not on insulin.  10-Leukocytosis: Secondary to PNA. Will order blood culture.    Code Status: Full Code.  Family Communication: Care discussed with son who was at bedside.  Disposition Plan: Remain step Down.   Consultants:  Nephrologist.   Procedures:  HD.   Antibiotics:  Vancomycin 3-4  Zosyn  3-4  Levaquin. 3-4  HPI/Subjective: Breathing better. Still with some cough.  Had BM yesterday. He was not able to sleep last night. He is tire. Son at bedside, update given.   Objective: Filed Vitals:   08/10/12 0447 08/10/12 0515 08/10/12 0804 08/10/12 0805  BP:  139/74    Pulse: 92 96    Temp:   97.8 F (36.6 C)   TempSrc:   Oral   Resp: 20 18    Height:      Weight:      SpO2:  98%  100%    Intake/Output Summary (Last 24 hours) at 08/10/12 0853 Last data filed at 08/10/12 0804  Gross per 24 hour  Intake      0 ml  Output      0 ml  Net      0 ml   Filed Weights   08/09/12 0936 08/09/12 1636 08/10/12 0430  Weight: 70 kg (154 lb 5.2 oz) 72.27 kg (159 lb 5.2  oz) 65.4 kg (144 lb 2.9 oz)    Exam:   General:  Cachetic, no distress.  Cardiovascular: S 1, S 2 RRR  Respiratory: Bilateral ronchus, crackles.  Abdomen: Bs present, soft, [place].   Musculoskeletal: LE no edema.   Data Reviewed: Basic Metabolic Panel:  Recent Labs Lab 08/05/12 1417 08/09/12 0519 08/09/12 1010 08/10/12 0650  NA 137 140  --  141  K 3.4* 3.0*  --  4.1  CL 95* 103  --  96  CO2 26  --   --  21  GLUCOSE 97 91  --  54*  BUN 31* 60*  --  45*  CREATININE 5.78* 7.90* 9.20* 6.58*  CALCIUM 10.2  --   --  9.8  PHOS  --   --  3.8 3.6   Liver Function Tests:  Recent Labs Lab 08/10/12 0650  ALBUMIN 3.3*   No results found for this basename: LIPASE, AMYLASE,  in the last 168 hours No results found for this basename: AMMONIA,  in the last 168 hours CBC:  Recent Labs Lab 08/05/12 1300 08/09/12 0441 08/09/12 0519 08/09/12 1010 08/10/12 0650  WBC 12.5* 8.1  --  17.2* 20.4*  NEUTROABS  --  7.8*  --   --   --   HGB 11.1* 11.5* 12.9* 11.3* 10.0*  HCT 34.3* 37.0*  38.0* 36.6* 30.8*  MCV 88.9 91.4  --  92.2 88.3  PLT 470* 190  --  189 145*   Cardiac Enzymes: No results found for this basename: CKTOTAL, CKMB, CKMBINDEX, TROPONINI,  in the last 168 hours BNP (last 3 results) No results found for this basename: PROBNP,  in the last 8760 hours CBG: No results found for this basename: GLUCAP,  in the last 168 hours  Recent Results (from the past 240 hour(s))  SURGICAL PCR SCREEN     Status: Abnormal   Collection Time    08/05/12  1:38 PM      Result Value Range Status   MRSA, PCR POSITIVE (*) NEGATIVE Final   Staphylococcus aureus POSITIVE (*) NEGATIVE Final   Comment:            The Xpert SA Assay (FDA     approved for NASAL specimens     in patients over 26 years of age),     is one component of     a comprehensive surveillance     program.  Test performance has     been validated by [employer] for patients greater     than or equal to 1 year  old.     It is not intended     to diagnose infection nor to     guide or monitor treatment.     Studies: Dg Chest Port 1 View  08/09/2012  *RADIOLOGY REPORT*  Clinical Data: Shortness of breath  PORTABLE CHEST - 1 VIEW  Comparison: Chest radiograph 08/09/2012  Findings: Right-sided pacemaker overlies stable cardiac silhouette. Vascular stent graft within the thoracic aorta appears well expanded.  There is air space disease within the right upper lobe which is increased in density compared to prior.  Bibasilar air space disease which is mild and similar.  IMPRESSION:  1.  Increase in density of right upper lobe pneumonia. 2.  Stable bibasilar airspace disease.   Original Report Authenticated By: Genevive Bi, M.D.    Dg Chest Port 1 View  08/09/2012  *RADIOLOGY REPORT*  Clinical Data: Shortness of breath and cough.  PORTABLE CHEST - 1 VIEW  Comparison: Chest radiograph performed 08/05/2012  Findings: There is new right midlung airspace opacification, compatible with multifocal pneumonia.  Mild left basilar opacity is also seen.  No definite pleural effusion or pneumothorax is seen.  The cardiomediastinal silhouette is borderline enlarged.  The patient is status post median sternotomy.  A vascular stent graft is noted along the thoracic aorta.  A pacemaker is seen overlying the right chest wall, with leads ending overlying the right atrium and right ventricle.  No acute osseous abnormalities are seen.  IMPRESSION:  1.  New right midlung and left basilar airspace opacification, compatible with multifocal pneumonia. 2.  Borderline cardiomegaly.   Original Report Authenticated By: Tonia Ghent, M.D.     Scheduled Meds: . albuterol  2.5 mg Nebulization Q6H  . atorvastatin  40 mg Oral Daily  . carvedilol  6.25 mg Oral BID WC  . Chlorhexidine Gluconate Cloth  6 each Topical Q0600  . darbepoetin (ARANESP) injection - DIALYSIS  60 mcg Intravenous Q Tue-HD  . docusate sodium  100 mg Oral BID  .  doxercalciferol  5 mcg Intravenous Q T,Th,Sa-HD  . heparin  5,000 Units Subcutaneous Q8H  . isosorbide mononitrate  30 mg Oral Daily  . [START ON 08/11/2012] levofloxacin (LEVAQUIN) IV  500 mg Intravenous Q48H  . multivitamin  1 tablet Oral Daily  . mupirocin ointment  1 application Nasal BID  . pantoprazole  40 mg Oral Daily  . piperacillin-tazobactam (ZOSYN)  IV  2.25 g Intravenous Q8H  . sevelamer carbonate  3,200 mg Oral TID WC  . sodium chloride  200 mL Intravenous Once  . vancomycin  750 mg Intravenous Q T,Th,Sa-HD   Continuous Infusions:   Principal Problem:   HCAP (healthcare-associated pneumonia) Active Problems:   HYPERTENSION, UNSPECIFIED   AORTIC INSUFFICIENCY   Congestive heart failure, unspecified   Second degree Mobitz II AV block   ESRD (end stage renal disease) on dialysis    Time spent: 35 minutes.    REGALADO,BELKYS  [facility] Pager [phone]. If 7PM-7AM, please contact night-coverage at [email or web address], password [facility] 08/10/2012, 8:53 AM  LOS: 1 day

## 2012-08-10 NOTE — Progress Notes (Signed)
S:  Feeling better O:BP 123/57  Pulse 95  Temp(Src) 97.4 F (36.3 C) (Oral)  Resp 22  Ht 5\' 6"  (1.676 m)  Wt 65.4 kg (144 lb 2.9 oz)  BMI 23.28 kg/m2  SpO2 93%  Intake/Output Summary (Last 24 hours) at 08/10/12 1253 Last data filed at 08/10/12 1158  Gross per 24 hour  Intake    490 ml  Output      0 ml  Net    490 ml   Weight change:  ZOX:WRUEA and alert CVS:RRR Resp: Crackles r base Abd:+ BS NTND Ext:no edema  Rt AVG + bruit NEURO:CNI Ox3 no asterixis   . albuterol  2.5 mg Nebulization Q6H  . atorvastatin  40 mg Oral Daily  . carvedilol  6.25 mg Oral BID WC  . Chlorhexidine Gluconate Cloth  6 each Topical Q0600  . darbepoetin (ARANESP) injection - DIALYSIS  60 mcg Intravenous Q Tue-HD  . docusate sodium  100 mg Oral BID  . doxercalciferol  5 mcg Intravenous Q T,Th,Sa-HD  . heparin  5,000 Units Subcutaneous Q8H  . [START ON 08/11/2012] levofloxacin (LEVAQUIN) IV  500 mg Intravenous Q48H  . multivitamin  1 tablet Oral Daily  . mupirocin ointment  1 application Nasal BID  . pantoprazole  40 mg Oral Daily  . piperacillin-tazobactam (ZOSYN)  IV  2.25 g Intravenous Q8H  . sevelamer carbonate  3,200 mg Oral TID WC  . sodium chloride  200 mL Intravenous Once  . vancomycin  750 mg Intravenous Q T,Th,Sa-HD   Dg Chest Port 1 View  08/09/2012  *RADIOLOGY REPORT*  Clinical Data: Shortness of breath  PORTABLE CHEST - 1 VIEW  Comparison: Chest radiograph 08/09/2012  Findings: Right-sided pacemaker overlies stable cardiac silhouette. Vascular stent graft within the thoracic aorta appears well expanded.  There is air space disease within the right upper lobe which is increased in density compared to prior.  Bibasilar air space disease which is mild and similar.  IMPRESSION:  1.  Increase in density of right upper lobe pneumonia. 2.  Stable bibasilar airspace disease.   Original Report Authenticated By: Genevive Bi, M.D.    Dg Chest Port 1 View  08/09/2012  *RADIOLOGY REPORT*  Clinical  Data: Shortness of breath and cough.  PORTABLE CHEST - 1 VIEW  Comparison: Chest radiograph performed 08/05/2012  Findings: There is new right midlung airspace opacification, compatible with multifocal pneumonia.  Mild left basilar opacity is also seen.  No definite pleural effusion or pneumothorax is seen.  The cardiomediastinal silhouette is borderline enlarged.  The patient is status post median sternotomy.  A vascular stent graft is noted along the thoracic aorta.  A pacemaker is seen overlying the right chest wall, with leads ending overlying the right atrium and right ventricle.  No acute osseous abnormalities are seen.  IMPRESSION:  1.  New right midlung and left basilar airspace opacification, compatible with multifocal pneumonia. 2.  Borderline cardiomegaly.   Original Report Authenticated By: Tonia Ghent, M.D.    BMET    Component Value Date/Time   NA 141 08/10/2012 0650   K 4.1 08/10/2012 0650   CL 96 08/10/2012 0650   CO2 21 08/10/2012 0650   GLUCOSE 54* 08/10/2012 0650   BUN 45* 08/10/2012 0650   CREATININE 6.58* 08/10/2012 0650   CALCIUM 9.8 08/10/2012 0650   GFRNONAA 7* 08/10/2012 0650   GFRAA 8* 08/10/2012 0650   CBC    Component Value Date/Time   WBC 20.4* 08/10/2012 5409  RBC 3.49* 08/10/2012 0650   HGB 10.0* 08/10/2012 0650   HCT 30.8* 08/10/2012 0650   PLT 145* 08/10/2012 0650   MCV 88.3 08/10/2012 0650   MCH 28.7 08/10/2012 0650   MCHC 32.5 08/10/2012 0650   RDW 16.2* 08/10/2012 0650   LYMPHSABS 0.1* 08/09/2012 0441   MONOABS 0.1 08/09/2012 0441   EOSABS 0.1 08/09/2012 0441   BASOSABS 0.0 08/09/2012 0441     Assessment: 1. ESRD 2. PNA 3. Anemia 4. Sec HPTH  Plan: 1. Plan HD in AM 2. Cont antibiotics 3. Decrease dose of renvela to 1600mg  AC   MATTINGLY,MICHAEL T

## 2012-08-10 NOTE — Progress Notes (Signed)
Pt admitted from HD into room 3305. A&O X4 VSS SpO2 100% on 100% NRB RR 22. Pt and his son oriented to unit and equipment. ELINK and CCMD notified about transfer. Will continue to monitor.

## 2012-08-10 NOTE — Evaluation (Signed)
Clinical/Bedside Swallow Evaluation Patient Details  Name: KYRAN CONNAUGHTON MRN: [id number] Date of Birth: [birth year]  Today's Date: 08/10/2012 Time: 1027-     Past Medical History:  Past Medical History  Diagnosis Date  . Hyperlipidemia   . Hypertension   . Congestive heart failure, unspecified   . Coronary atherosclerosis of native coronary artery   . 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 End stage renal disease     on HD MWF  . Aortoiliac occlusive disease   . Secondary hyperparathyroidism   . Gout   . MI (myocardial infarction)   . Hemodialysis patient 1999  . Shortness of breath   . Pneumonia 08/09/2012   Past Surgical History:  Past Surgical History  Procedure Laterality Date  . Coronary artery bypass graft  04/18/05    Rachell Cipro  . Ppm  11/18/05    Med[employer]onic EnRhythm P1501-complete heart block  . Dialysis fistula creation     HPI:  Jonathan Padilla is an 79 y.o. male with hx of ESRD on HD (T, TH, Sat), Mobitz II s/p ppm, recent pacemaker generator replacement, HTN, CHF, PVD, CAD s/p prior MI, presents to the ER with one day hx of cough, shortness of breath and having bloody sputum.  He denied any chest pain, fever, chills, nausea, or vomiting.  He has no orthopnea or PND.  Evaluation in the ER included a CXR which showed multifocal consolidation consistent with multifocal PNA.  His K is 3.0, WBC of 12.5K, Hb of 11.5g/DL, Cr of 7.9.  Hospitalist was asked to admit him for HCAP.   Assessment / Plan / Recommendation Clinical Impression  Patient appears to have normal swallow function for all consistencies.  Patient was able to chew and swallow graham crackers in a timely manner, despite being edentulous.  Son reports, "He even eats corn on the cob without his teeth."  No overt coughing noted after swallows, timely swallow initiation noted, and good laryngeal elevation palpated.  Pna. is in the upper lobe, however CXR reports there is bibasilar  airspace disease.  If silent aspiration is expected, further objective evaluation may be indicated.      Aspiration Risk  Mild    Diet Recommendation Regular;Thin liquid (Renal restrictions)   Liquid Administration via: Straw;Cup Medication Administration: Whole meds with liquid Supervision: Patient able to self feed Compensations: Slow rate;Small sips/bites Postural Changes and/or Swallow Maneuvers: Seated upright 90 degrees    Other  Recommendations Oral Care Recommendations: Oral care QID   Follow Up Recommendations  None    Frequency and Duration min 1 x/week  1 week   Pertinent Vitals/Pain n/a    SLP Swallow Goals Patient will consume recommended diet without observed clinical signs of aspiration with: Supervision/safety   Swallow Study Prior Functional Status       General HPI: Jonathan Apley Baty is an 79 y.o. male with hx of ESRD on HD (T, TH, Sat), Mobitz II s/p ppm, recent pacemaker generator replacement, HTN, CHF, PVD, CAD s/p prior MI, presents to the ER with one day hx of cough, shortness of breath and having bloody sputum.  He denied any chest pain, fever, chills, nausea, or vomiting.  He has no orthopnea or PND.  Evaluation in the ER included a CXR which showed multifocal consolidation consistent with multifocal PNA.  His K is 3.0, WBC of 12.5K, Hb of 11.5g/DL, Cr of 7.9.  Hospitalist was asked to admit him for HCAP. Type  of Study: Bedside swallow evaluation Diet Prior to this Study: Regular (Renal restrictions) Temperature Spikes Noted: No Respiratory Status: Other (comment) (Increase in density of right upper lobe pna.; bibasilar dz.) History of Recent Intubation: No Behavior/Cognition: Alert;Cooperative Oral Cavity - Dentition: Edentulous (Has dentures at home, but does not wear them.) Self-Feeding Abilities: Able to feed self;Needs set up Patient Positioning: Upright in bed Baseline Vocal Quality: Clear Volitional Cough: Strong Volitional Swallow: Able to  elicit    Oral/Motor/Sensory Function Overall Oral Motor/Sensory Function: Appears within functional limits for tasks assessed   Ice Chips Ice chips: Not tested   Thin Liquid Thin Liquid: Within functional limits Presentation: Cup;Straw    Nectar Thick Nectar Thick Liquid: Not tested   Honey Thick Honey Thick Liquid: Not tested   Puree Puree: Within functional limits Presentation: Spoon   Solid   GO    Solid: Within functional limits Presentation: Self Daine Gravel, Lori T 08/10/2012,10:56 AM

## 2012-08-11 LAB — GLUCOSE, CAPILLARY: Glucose-Capillary: 134 mg/dL — ABNORMAL HIGH (ref 70–99)

## 2012-08-11 LAB — RENAL FUNCTION PANEL
CO2: 26 mEq/L (ref 19–32)
GFR calc Af Amer: 7 mL/min — ABNORMAL LOW (ref >90)
Glucose, Bld: 102 mg/dL — ABNORMAL HIGH (ref 70–99)
Phosphorus: 2.1 mg/dL — ABNORMAL LOW (ref 2.3–4.6)
Potassium: 4.2 mEq/L (ref 3.5–5.1)
Sodium: 139 mEq/L (ref 135–145)

## 2012-08-11 LAB — CBC WITH DIFFERENTIAL/PLATELET
Lymphocytes Relative: 4 % — ABNORMAL LOW (ref 12–46)
Lymphs Abs: 0.6 10*3/uL — ABNORMAL LOW (ref 0.7–4.0)
Neutro Abs: 12.7 10*3/uL — ABNORMAL HIGH (ref 1.7–7.7)
Neutrophils Relative %: 89 % — ABNORMAL HIGH (ref 43–77)
Platelets: 135 10*3/uL — ABNORMAL LOW (ref 150–400)
RBC: 3.29 MIL/uL — ABNORMAL LOW (ref 4.22–5.81)
WBC: 14.3 10*3/uL — ABNORMAL HIGH (ref 4.0–10.5)

## 2012-08-11 MED ORDER — DOXERCALCIFEROL 4 MCG/2ML IV SOLN
INTRAVENOUS | Status: AC
  Filled 2012-08-11: qty 4

## 2012-08-11 NOTE — Procedures (Signed)
Pt seen on HD.  Ap 150  Vp 250.  BFR 375 due to increased Vp.  On 2K bath.  Overall feels better but big issue is eating which had been a problem before admission.

## 2012-08-11 NOTE — Progress Notes (Signed)
Pt back from HD, VSS, transfer to 6700, report called.

## 2012-08-11 NOTE — Progress Notes (Signed)
[facility] PROGRESS NOTE  Jonathan Padilla ZHY:[id number] DOB: 1927-11-29 DOA: 08/09/2012 PCP: No primary Artina Minella on file.  Assessment/Plan:  1-HCAP (healthcare-associated pneumonia)  -Continue with  Vancomycin, levaquin and Zosyn day 3. -WBC trending down. Afebrile. -speech for swallow evaluation.   2-Acute on Chronic Hypoxic Respiratory Failure  -Multifactorial:  PNA, Pulmonary edema/COPD  -O2 as needed  -Improved, now on 2 L oxygen.   3-ESRD on HD  -Renal following.  -For dialysis today.  4-Dyslipidemia  -c/w Lipitor.   5-HTN  Episode of hypotension. Will hold Imdur. Holder parameters for Coreg. BP stable.  6-COPD   Nebs treatments. 7-H/o Second degree Mobitz II AV block  - PPM in situ-battery was just recently replaced  8-Aortoiliac occlusive disease   has scheduled outpatient appointment 9-Hypoglycemia: Hypoglycemia protocol. Encourage oral intake. Not on insulin.  10-Leukocytosis: Secondary to PNA.  blood culture pending. Trending down.    Code Status: Full Code.  Family Communication: Care discussed with son who was at bedside.  Disposition Plan: transfer to regular bed.    Consultants:  Nephrologist.   Procedures:  HD.   Antibiotics:  Vancomycin 3-4  Zosyn  3-4  Levaquin. 3-4  HPI/Subjective: Feeling better. Breathing better. Still not eating well, poor appetite.   Objective: Filed Vitals:   08/10/12 2225 08/10/12 2300 08/11/12 0015 08/11/12 0345  BP: 95/53  134/60 102/50  Pulse: 87 90 89 88  Temp:  98.5 F (36.9 C)  98.4 F (36.9 C)  TempSrc:  Oral  Oral  Resp: 18 18 20 21   Height:      Weight:      SpO2: 98% 98% 95% 95%    Intake/Output Summary (Last 24 hours) at 08/11/12 0741 Last data filed at 08/10/12 1800  Gross per 24 hour  Intake    990 ml  Output      0 ml  Net    990 ml   Filed Weights   08/09/12 1636 08/09/12 2021 08/10/12 0430  Weight: 72.27 kg (159 lb 5.2 oz) 71.5 kg (157 lb 10.1 oz) 65.4 kg (144 lb 2.9 oz)     Exam:   General:  Cachetic, no distress.  Cardiovascular: S 1, S 2 RRR  Respiratory: Bilateral ronchus.  Abdomen: Bs present, soft, NT.   Musculoskeletal: LE no edema.   Data Reviewed: Basic Metabolic Panel:  Recent Labs Lab 08/05/12 1417 08/09/12 0519 08/09/12 1010 08/10/12 0650 08/11/12 0430  NA 137 140  --  141 139  K 3.4* 3.0*  --  4.1 4.2  CL 95* 103  --  96 96  CO2 26  --   --  21 26  GLUCOSE 97 91  --  54* 102*  BUN 31* 60*  --  45* 64*  CREATININE 5.78* 7.90* 9.20* 6.58* 7.78*  CALCIUM 10.2  --   --  9.8 9.9  PHOS  --   --  3.8 3.6 2.1*   Liver Function Tests:  Recent Labs Lab 08/10/12 0650 08/11/12 0430  ALBUMIN 3.3* 2.9*   No results found for this basename: LIPASE, AMYLASE,  in the last 168 hours No results found for this basename: AMMONIA,  in the last 168 hours CBC:  Recent Labs Lab 08/05/12 1300 08/09/12 0441 08/09/12 0519 08/09/12 1010 08/10/12 0650 08/11/12 0430  WBC 12.5* 8.1  --  17.2* 20.4* 14.3*  NEUTROABS  --  7.8*  --   --   --  12.7*  HGB 11.1* 11.5* 12.9* 11.3* 10.0* 9.6*  HCT  34.3* 37.0* 38.0* 36.6* 30.8* 28.8*  MCV 88.9 91.4  --  92.2 88.3 87.5  PLT 470* 190  --  189 145* 135*   Cardiac Enzymes: No results found for this basename: CKTOTAL, CKMB, CKMBINDEX, TROPONINI,  in the last 168 hours BNP (last 3 results) No results found for this basename: PROBNP,  in the last 8760 hours CBG:  Recent Labs Lab 08/10/12 0925 08/10/12 1106 08/10/12 1155 08/10/12 1555 08/10/12 1953  GLUCAP 59* 71 88 91 115*    Recent Results (from the past 240 hour(s))  SURGICAL PCR SCREEN     Status: Abnormal   Collection Time    08/05/12  1:38 PM      Result Value Range Status   MRSA, PCR POSITIVE (*) NEGATIVE Final   Staphylococcus aureus POSITIVE (*) NEGATIVE Final   Comment:            The Xpert SA Assay (FDA     approved for NASAL specimens     in patients over 51 years of age),     is one component of     a comprehensive  surveillance     program.  Test performance has     been validated by [employer] for patients greater     than or equal to 48 year old.     It is not intended     to diagnose infection nor to     guide or monitor treatment.     Studies: Dg Chest Port 1 View  08/09/2012  *RADIOLOGY REPORT*  Clinical Data: Shortness of breath  PORTABLE CHEST - 1 VIEW  Comparison: Chest radiograph 08/09/2012  Findings: Right-sided pacemaker overlies stable cardiac silhouette. Vascular stent graft within the thoracic aorta appears well expanded.  There is air space disease within the right upper lobe which is increased in density compared to prior.  Bibasilar air space disease which is mild and similar.  IMPRESSION:  1.  Increase in density of right upper lobe pneumonia. 2.  Stable bibasilar airspace disease.   Original Report Authenticated By: Genevive Bi, M.D.     Scheduled Meds: . atorvastatin  40 mg Oral Daily  . carvedilol  6.25 mg Oral BID WC  . Chlorhexidine Gluconate Cloth  6 each Topical Q0600  . darbepoetin (ARANESP) injection - DIALYSIS  60 mcg Intravenous Q Tue-HD  . docusate sodium  100 mg Oral BID  . doxercalciferol  5 mcg Intravenous Q T,Th,Sa-HD  . heparin  5,000 Units Subcutaneous Q8H  . levofloxacin (LEVAQUIN) IV  500 mg Intravenous Q48H  . multivitamin  1 tablet Oral Daily  . mupirocin ointment  1 application Nasal BID  . pantoprazole  40 mg Oral Daily  . piperacillin-tazobactam (ZOSYN)  IV  2.25 g Intravenous Q8H  . sevelamer carbonate  1,600 mg Oral TID WC  . sodium chloride  200 mL Intravenous Once  . vancomycin  750 mg Intravenous Q T,Th,Sa-HD   Continuous Infusions:   Principal Problem:   HCAP (healthcare-associated pneumonia) Active Problems:   HYPERTENSION, UNSPECIFIED   AORTIC INSUFFICIENCY   Congestive heart failure, unspecified   Second degree Mobitz II AV block   ESRD (end stage renal disease) on dialysis   Acute and chronic respiratory failure    Leukocytosis, unspecified    Time spent: 25 minutes.    REGALADO,BELKYS  [facility] Pager [phone]. If 7PM-7AM, please contact night-coverage at [email or web address], password [facility] 08/11/2012, 7:41 AM  LOS: 2 days

## 2012-08-11 NOTE — Progress Notes (Signed)
Speech Language Pathology Dysphagia Treatment Patient Details Name: Jonathan Padilla MRN: [id number] DOB: [birth year] Today's Date: 08/11/2012 Time: 6295-2841 SLP Time Calculation (min): 8 min  Assessment / Plan / Recommendation Clinical Impression  Pt demonstrated wet vocal quality at baseline, with verbal cue, pt expectorated a large bloody quantity of mucous. Therafter, voice remianed dry, pt consumed water without evidence of aspiration. Pt refused soild textures. No SLP f/u requried at this time. If concerns arise, please order MBS, objective test, to determine if pt is silently aspirating. SLP will sign off at this time.     Diet Recommendation  Continue with Current Diet: Regular;Thin liquid    SLP Plan Discharge SLP treatment due to (comment)   Pertinent Vitals/Pain NA   Swallowing Goals  SLP Swallowing Goals Patient will consume recommended diet without observed clinical signs of aspiration with: Supervision/safety Swallow Study Goal #1 - Progress: Met  General Temperature Spikes Noted: No Respiratory Status: Room air Behavior/Cognition: Alert;Cooperative Oral Cavity - Dentition: Edentulous Patient Positioning: Upright in bed  Oral Cavity - Oral Hygiene     Dysphagia Treatment Treatment focused on: Skilled observation of diet tolerance Treatment Methods/Modalities: Skilled observation Patient observed directly with PO's: Yes Type of PO's observed: Thin liquids Feeding: Able to feed self Liquids provided via: Cup Type of cueing: Verbal   GO    Harlon Ditty, MA CCC-SLP [phone]  Claudine Mouton 08/11/2012, 4:12 PM

## 2012-08-11 NOTE — Progress Notes (Signed)
Pt arrived from 3300. Alert and oriented. No complaints of pain. Skin dry, intact. Oriented pt to unit. Bed in low position. Call bell within reach. Bed alarm on. Will continue to monitor.

## 2012-08-12 LAB — GLUCOSE, CAPILLARY
Glucose-Capillary: 97 mg/dL (ref 70–99)
Glucose-Capillary: 92 mg/dL (ref 70–99)
Glucose-Capillary: 104 mg/dL — ABNORMAL HIGH (ref 70–99)
Glucose-Capillary: 91 mg/dL (ref 70–99)

## 2012-08-12 MED ORDER — RENA-VITE PO TABS
1.0000 | ORAL_TABLET | Freq: Every day | ORAL | Status: DC
  Administered 2012-08-12: 1 via ORAL
  Filled 2012-08-12 (×2): qty 1

## 2012-08-12 MED ORDER — CHLORHEXIDINE GLUCONATE CLOTH 2 % EX PADS
6.0000 | MEDICATED_PAD | Freq: Every day | CUTANEOUS | Status: AC
  Administered 2012-08-12 – 2012-08-13 (×2): 6 via TOPICAL

## 2012-08-12 NOTE — Evaluation (Signed)
Physical Therapy Evaluation Patient Details Name: Jonathan Padilla MRN: [id number] DOB: [birth year] Today's Date: 08/12/2012 Time: 4098-1191 PT Time Calculation (min): 18 min  PT Assessment / Plan / Recommendation Clinical Impression  Pt admitted with HCAP and demonstrates decreased mobility, balance, knowledge of DME and decreased activity tolerance. Pt will benefit from acute therapy to maximize transfers, gait and function and decrease burden of care. Pt in chair with legs elevated and tray table across with education not to get up unassisted and pt demonstrated use of call bell.     PT Assessment  Patient needs continued PT services    Follow Up Recommendations  Home health PT;Supervision for mobility/OOB    Does the patient have the potential to tolerate intense rehabilitation      Barriers to Discharge None      Equipment Recommendations  None recommended by PT    Recommendations for Other Services     Frequency Min 3X/week    Precautions / Restrictions Precautions Precautions: Fall   Pertinent Vitals/Pain No pain sats 93% on 2.5L BP 93/47 sitting, no dizziness and pt reports baseline BP      Mobility  Bed Mobility Bed Mobility: Supine to Sit Supine to Sit: HOB elevated;With rails;5: Supervision Details for Bed Mobility Assistance: cueing for sequence with HOB 25degrees Transfers Transfers: Sit to Stand;Stand to Sit Sit to Stand: 4: Min guard;From bed Stand to Sit: 4: Min assist;To chair/3-in-1 Details for Transfer Assistance: cueing for hand placement and safety with assist to control descent to chair as pt sitting prior to backing to surface Ambulation/Gait Ambulation/Gait Assistance: 4: Min assist Ambulation Distance (Feet): 16 Feet Assistive device: Rolling walker Ambulation/Gait Assistance Details: max cueing to step into RW and for safety pt with 2 periods of partial LOB with assist to correct and assist to direct and turn RW Gait Pattern: Shuffle;Trunk  flexed Gait velocity: decreased Stairs: No    Exercises     PT Diagnosis: Difficulty walking  PT Problem List: Decreased activity tolerance;Decreased balance;Decreased mobility;Decreased knowledge of use of DME;Decreased cognition PT Treatment Interventions: Gait training;Functional mobility training;Therapeutic activities;Therapeutic exercise;Balance training;Cognitive remediation;Patient/family education   PT Goals Acute Rehab PT Goals PT Goal Formulation: With patient Time For Goal Achievement: 08/26/12 Potential to Achieve Goals: Good Pt will go Sit to Supine/Side: with supervision;with HOB 0 degrees PT Goal: Sit to Supine/Side - Progress: Goal set today Pt will go Sit to Stand: with supervision PT Goal: Sit to Stand - Progress: Goal set today Pt will go Stand to Sit: with supervision PT Goal: Stand to Sit - Progress: Goal set today Pt will Ambulate: 51 - 150 feet;with least restrictive assistive device;with min assist PT Goal: Ambulate - Progress: Goal set today  Visit Information  Last PT Received On: 08/12/12 Assistance Needed: +1    Subjective Data  Subjective: "I have a nurse there with me all the time" Patient Stated Goal: return home for dinner   Prior Functioning  Home Living Lives With: Daughter Available Help at Discharge: Family;Personal care attendant;Available 24 hours/day Type of Home: House Home Access: Level entry Home Layout: One level Bathroom Shower/Tub: Engineer, manufacturing systems: Standard Home Adaptive Equipment: Bedside commode/3-in-1;Tub transfer bench;Walker - rolling;Walker - standard;Straight cane Prior Function Level of Independence: Needs assistance Needs Assistance: Light Housekeeping;Meal Prep;Dressing;Bathing;Gait Bath: Moderate Dressing: Moderate Meal Prep: Total Light Housekeeping: Total Gait Assistance: supervision and cane Able to Take Stairs?: No Driving: No Vocation: Retired Musician: Biomedical scientist Overall Cognitive Status:  Impaired Area of Impairment: Safety/judgement Arousal/Alertness: Awake/alert Orientation Level: Disoriented to;Time Behavior During Session: WFL for tasks performed Safety/Judgement: Decreased safety judgement for tasks assessed;Decreased awareness of need for assistance    Extremity/Trunk Assessment Right Upper Extremity Assessment RUE ROM/Strength/Tone: [facility] for tasks assessed Left Upper Extremity Assessment LUE ROM/Strength/Tone: WFL for tasks assessed Right Lower Extremity Assessment RLE ROM/Strength/Tone: [facility] for tasks assessed Left Lower Extremity Assessment LLE ROM/Strength/Tone: WFL for tasks assessed Trunk Assessment Trunk Assessment: Normal   Balance    End of Session PT - End of Session Equipment Utilized During Treatment: Gait belt;Oxygen Activity Tolerance: Patient limited by fatigue Patient left: in chair;with call bell/phone within reach  GP     Toney Sang [facility] 08/12/2012, 3:29 PM Delaney Meigs, PT [phone]

## 2012-08-12 NOTE — Progress Notes (Signed)
S:  Feeling much better.  Has an appetite O:BP 109/54  Pulse 89  Temp(Src) 97.4 F (36.3 C) (Oral)  Resp 20  Ht 5\' 6"  (1.676 m)  Wt 65.8 kg (145 lb 1 oz)  BMI 23.42 kg/m2  SpO2 92%  Intake/Output Summary (Last 24 hours) at 08/12/12 0933 Last data filed at 08/12/12 6295  Gross per 24 hour  Intake    500 ml  Output   1787 ml  Net  -1287 ml   Weight change:  MWU:XLKGM and alert CVS:RRR Resp: Crackles r base Abd:+ BS NTND Ext:no edema  Rt AVG + bruit NEURO:CNI Ox3 no asterixis   . atorvastatin  40 mg Oral Daily  . carvedilol  6.25 mg Oral BID WC  . Chlorhexidine Gluconate Cloth  6 each Topical Q0600  . darbepoetin (ARANESP) injection - DIALYSIS  60 mcg Intravenous Q Tue-HD  . docusate sodium  100 mg Oral BID  . doxercalciferol  5 mcg Intravenous Q T,Th,Sa-HD  . heparin  5,000 Units Subcutaneous Q8H  . levofloxacin (LEVAQUIN) IV  500 mg Intravenous Q48H  . multivitamin  1 tablet Oral QHS  . mupirocin ointment  1 application Nasal BID  . pantoprazole  40 mg Oral Daily  . piperacillin-tazobactam (ZOSYN)  IV  2.25 g Intravenous Q8H  . sodium chloride  200 mL Intravenous Once  . vancomycin  750 mg Intravenous Q T,Th,Sa-HD   No results found. BMET    Component Value Date/Time   NA 139 08/11/2012 0430   K 4.2 08/11/2012 0430   CL 96 08/11/2012 0430   CO2 26 08/11/2012 0430   GLUCOSE 102* 08/11/2012 0430   BUN 64* 08/11/2012 0430   CREATININE 7.78* 08/11/2012 0430   CALCIUM 9.9 08/11/2012 0430   GFRNONAA 6* 08/11/2012 0430   GFRAA 7* 08/11/2012 0430   CBC    Component Value Date/Time   WBC 14.3* 08/11/2012 0430   RBC 3.29* 08/11/2012 0430   HGB 9.6* 08/11/2012 0430   HCT 28.8* 08/11/2012 0430   PLT 135* 08/11/2012 0430   MCV 87.5 08/11/2012 0430   MCH 29.2 08/11/2012 0430   MCHC 33.3 08/11/2012 0430   RDW 16.0* 08/11/2012 0430   LYMPHSABS 0.6* 08/11/2012 0430   MONOABS 1.0 08/11/2012 0430   EOSABS 0.1 08/11/2012 0430   BASOSABS 0.0 08/11/2012 0430     Assessment: 1. ESRD 2. PNA, improving 3.  Anemia 4. Sec HPTH 5. HypoPO4  Plan: 1. Plan HD in AM 2. DC renvela  Jonathan Padilla T

## 2012-08-12 NOTE — Progress Notes (Signed)
[facility] PROGRESS NOTE  Jonathan Padilla GNF:[id number] DOB: 1928/02/10 DOA: 08/09/2012 PCP: No primary provider on file.  Assessment/Plan:  1-HCAP (healthcare-associated pneumonia)  -Continue with  Vancomycin, levaquin and Zosyn day 4. -WBC trending down. Afebrile. -speech for swallow evaluation.  -Discharge tomorrow after dialysis. Will discharge him on Levaquin.   2-Acute on Chronic Hypoxic Respiratory Failure  -Multifactorial:  PNA, Pulmonary edema/COPD  -O2 as needed  -Improved, now on 2 L oxygen.   3-ESRD on HD  -Renal following.  -Dialysis 3-8  4-Dyslipidemia  -c/w Lipitor.   5-HTN  Episode of hypotension. Will hold Imdur. Holder parameters for Coreg. BP stable.  6-COPD   Nebs treatments. 7-H/o Second degree Mobitz II AV block  - PPM in situ-battery was just recently replaced  8-Aortoiliac occlusive disease   has scheduled outpatient appointment 9-Hypoglycemia: Hypoglycemia protocol. Encourage oral intake. Not on insulin.  10-Leukocytosis: Secondary to PNA.  blood culture pending. Trending down.    Code Status: Full Code.  Family Communication: Care discussed with son who was at bedside.  Disposition Plan: Home probably tomorrow after dialysis.   Consultants:  Nephrologist.   Procedures:  HD.   Antibiotics:  Vancomycin 3-4  Zosyn  3-4  Levaquin. 3-4  HPI/Subjective: Feeling better. Breathing better. Eating better.   Objective: Filed Vitals:   08/11/12 2018 08/12/12 0415 08/12/12 1430 08/12/12 1521  BP: 121/47 109/54 94/49 93/47   Pulse: 83 89 81   Temp: 98.4 F (36.9 C) 97.4 F (36.3 C) 97.8 F (36.6 C)   TempSrc: Oral Oral Oral   Resp: 20 20 20    Height:      Weight:      SpO2: 95% 92% 93% 93%    Intake/Output Summary (Last 24 hours) at 08/12/12 1645 Last data filed at 08/12/12 0609  Gross per 24 hour  Intake    500 ml  Output      0 ml  Net    500 ml   Filed Weights   08/10/12 0430 08/11/12 0750 08/11/12 1139  Weight:  65.4 kg (144 lb 2.9 oz) 66.4 kg (146 lb 6.2 oz) 65.8 kg (145 lb 1 oz)    Exam:   General:  Cachetic, no distress.  Cardiovascular: S 1, S 2 RRR  Respiratory: Crackles bilateral.   Abdomen: Bs present, soft, NT.   Musculoskeletal: LE no edema.   Data Reviewed: Basic Metabolic Panel:  Recent Labs Lab 08/09/12 0519 08/09/12 1010 08/10/12 0650 08/11/12 0430  NA 140  --  141 139  K 3.0*  --  4.1 4.2  CL 103  --  96 96  CO2  --   --  21 26  GLUCOSE 91  --  54* 102*  BUN 60*  --  45* 64*  CREATININE 7.90* 9.20* 6.58* 7.78*  CALCIUM  --   --  9.8 9.9  PHOS  --  3.8 3.6 2.1*   Liver Function Tests:  Recent Labs Lab 08/10/12 0650 08/11/12 0430  ALBUMIN 3.3* 2.9*   No results found for this basename: LIPASE, AMYLASE,  in the last 168 hours No results found for this basename: AMMONIA,  in the last 168 hours CBC:  Recent Labs Lab 08/09/12 0441 08/09/12 0519 08/09/12 1010 08/10/12 0650 08/11/12 0430  WBC 8.1  --  17.2* 20.4* 14.3*  NEUTROABS 7.8*  --   --   --  12.7*  HGB 11.5* 12.9* 11.3* 10.0* 9.6*  HCT 37.0* 38.0* 36.6* 30.8* 28.8*  MCV 91.4  --  92.2 88.3 87.5  PLT 190  --  189 145* 135*   Cardiac Enzymes: No results found for this basename: CKTOTAL, CKMB, CKMBINDEX, TROPONINI,  in the last 168 hours BNP (last 3 results) No results found for this basename: PROBNP,  in the last 8760 hours CBG:  Recent Labs Lab 08/12/12 0010 08/12/12 0419 08/12/12 0757 08/12/12 1237 08/12/12 1601  GLUCAP 94 92 98 124* 104*    Recent Results (from the past 240 hour(s))  SURGICAL PCR SCREEN     Status: Abnormal   Collection Time    08/05/12  1:38 PM      Result Value Range Status   MRSA, PCR POSITIVE (*) NEGATIVE Final   Staphylococcus aureus POSITIVE (*) NEGATIVE Final   Comment:            The Xpert SA Assay (FDA     approved for NASAL specimens     in patients over 52 years of age),     is one component of     a comprehensive surveillance     program.   Test performance has     been validated by [employer] for patients greater     than or equal to 12 year old.     It is not intended     to diagnose infection nor to     guide or monitor treatment.  CULTURE, BLOOD (ROUTINE X 2)     Status: None   Collection Time    08/10/12 10:00 AM      Result Value Range Status   Specimen Description BLOOD ARM LEFT   Final   Special Requests BOTTLES DRAWN AEROBIC AND ANAEROBIC 5CC   Final   Culture  Setup Time 08/10/2012 17:05   Final   Culture     Final   Value:        BLOOD CULTURE RECEIVED NO GROWTH TO DATE CULTURE WILL BE HELD FOR 5 DAYS BEFORE ISSUING A FINAL NEGATIVE REPORT   Report Status PENDING   Incomplete  CULTURE, BLOOD (ROUTINE X 2)     Status: None   Collection Time    08/10/12 10:15 AM      Result Value Range Status   Specimen Description BLOOD HAND LEFT   Final   Special Requests BOTTLES DRAWN AEROBIC ONLY 5CC   Final   Culture  Setup Time 08/10/2012 17:05   Final   Culture     Final   Value:        BLOOD CULTURE RECEIVED NO GROWTH TO DATE CULTURE WILL BE HELD FOR 5 DAYS BEFORE ISSUING A FINAL NEGATIVE REPORT   Report Status PENDING   Incomplete     Studies: No results found.  Scheduled Meds: . atorvastatin  40 mg Oral Daily  . carvedilol  6.25 mg Oral BID WC  . Chlorhexidine Gluconate Cloth  6 each Topical Q0600  . darbepoetin (ARANESP) injection - DIALYSIS  60 mcg Intravenous Q Tue-HD  . docusate sodium  100 mg Oral BID  . doxercalciferol  5 mcg Intravenous Q T,Th,Sa-HD  . heparin  5,000 Units Subcutaneous Q8H  . levofloxacin (LEVAQUIN) IV  500 mg Intravenous Q48H  . multivitamin  1 tablet Oral QHS  . mupirocin ointment  1 application Nasal BID  . pantoprazole  40 mg Oral Daily  . piperacillin-tazobactam (ZOSYN)  IV  2.25 g Intravenous Q8H  . sodium chloride  200 mL Intravenous Once  . vancomycin  750 mg  Intravenous Q T,Th,Sa-HD   Continuous Infusions:   Principal Problem:   HCAP (healthcare-associated  pneumonia) Active Problems:   HYPERTENSION, UNSPECIFIED   AORTIC INSUFFICIENCY   Congestive heart failure, unspecified   Second degree Mobitz II AV block   ESRD (end stage renal disease) on dialysis   Acute and chronic respiratory failure   Leukocytosis, unspecified    Time spent: 25 minutes.    REGALADO,BELKYS  [facility] Pager [phone]. If 7PM-7AM, please contact night-coverage at [email or web address], password [facility] 08/12/2012, 4:45 PM  LOS: 3 days

## 2012-08-12 NOTE — Progress Notes (Signed)
ANTIBIOTIC CONSULT NOTE - FOLLOW UP  Pharmacy Consult for Vancomycin + Levaquin + Zosyn Indication: Empiric HCAP coverage  No Known Allergies  Patient Measurements: Height: 5\' 6"  (167.6 cm) Weight: 145 lb 1 oz (65.8 kg) IBW/kg (Calculated) : 63.8  Vital Signs: Temp: 97.4 F (36.3 C) (03/07 0415) Temp src: Oral (03/07 0415) BP: 109/54 mmHg (03/07 0415) Pulse Rate: 89 (03/07 0415) Intake/Output from previous day: 03/06 0701 - 03/07 0700 In: 500 [IV Piggyback:500] Out: 1787  Intake/Output from this shift:    Labs:  Recent Labs  08/10/12 0650 08/11/12 0430  WBC 20.4* 14.3*  HGB 10.0* 9.6*  PLT 145* 135*  CREATININE 6.58* 7.78*   Estimated Creatinine Clearance: 6.5 ml/min (by C-G formula based on Cr of 7.78). No results found for this basename: VANCOTROUGH, Leodis Binet, VANCORANDOM, GENTTROUGH, GENTPEAK, GENTRANDOM, TOBRATROUGH, TOBRAPEAK, TOBRARND, AMIKACINPEAK, AMIKACINTROU, AMIKACIN,  in the last 72 hours   Microbiology: Recent Results (from the past 720 hour(s))  CULTURE, BLOOD (ROUTINE X 2)     Status: None   Collection Time    08/10/12 10:00 AM      Result Value Range Status   Specimen Description BLOOD ARM LEFT   Final   Special Requests BOTTLES DRAWN AEROBIC AND ANAEROBIC 5CC   Final   Culture  Setup Time 08/10/2012 17:05   Final   Culture     Final   Value:        BLOOD CULTURE RECEIVED NO GROWTH TO DATE CULTURE WILL BE HELD FOR 5 DAYS BEFORE ISSUING A FINAL NEGATIVE REPORT   Report Status PENDING   Incomplete  CULTURE, BLOOD (ROUTINE X 2)     Status: None   Collection Time    08/10/12 10:15 AM      Result Value Range Status   Specimen Description BLOOD HAND LEFT   Final   Special Requests BOTTLES DRAWN AEROBIC ONLY 5CC   Final   Culture  Setup Time 08/10/2012 17:05   Final   Culture     Final   Value:        BLOOD CULTURE RECEIVED NO GROWTH TO DATE CULTURE WILL BE HELD FOR 5 DAYS BEFORE ISSUING A FINAL NEGATIVE REPORT   Report Status PENDING   Incomplete     Anti-infectives   Start     Dose/Rate Route Frequency Ordered Stop   08/11/12 2000  levofloxacin (LEVAQUIN) IVPB 500 mg     500 mg 100 mL/hr over 60 Minutes Intravenous Every 48 hours 08/09/12 1948     08/09/12 2000  levofloxacin (LEVAQUIN) IVPB 750 mg     750 mg 100 mL/hr over 90 Minutes Intravenous NOW 08/09/12 1948 08/09/12 2356   08/09/12 1400  piperacillin-tazobactam (ZOSYN) IVPB 2.25 g     2.25 g 100 mL/hr over 30 Minutes Intravenous Every 8 hours 08/09/12 1138     08/09/12 1400  vancomycin (VANCOCIN) 750 mg in sodium chloride 0.9 % 150 mL IVPB     750 mg 150 mL/hr over 60 Minutes Intravenous Every T-Th-Sa (Hemodialysis) 08/09/12 1317     08/09/12 1100  piperacillin-tazobactam (ZOSYN) IVPB 2.25 g  Status:  Discontinued     2.25 g 100 mL/hr over 30 Minutes Intravenous Every 8 hours 08/09/12 1017 08/09/12 1138   08/09/12 1030  vancomycin (VANCOCIN) 500 mg in sodium chloride 0.9 % 100 mL IVPB     500 mg 100 mL/hr over 60 Minutes Intravenous  Once 08/09/12 1016 08/09/12 1307   08/09/12 0945  piperacillin-tazobactam (ZOSYN) IVPB 3.375  g  Status:  Discontinued     3.375 g 100 mL/hr over 30 Minutes Intravenous 3 times per day 08/09/12 0936 08/09/12 1016   08/09/12 0530  piperacillin-tazobactam (ZOSYN) IVPB 3.375 g     3.375 g 12.5 mL/hr over 240 Minutes Intravenous  Once 08/09/12 0527 08/09/12 0634   08/09/12 0530  vancomycin (VANCOCIN) IVPB 1000 mg/200 mL premix     1,000 mg 200 mL/hr over 60 Minutes Intravenous  Once 08/09/12 0527 08/09/12 0815      Assessment: 79 y.o. M with ESRD who continues on Vancomycin + Levaquin per Rx and Zosyn per for empiric HCAP coverage. No sputum culture yet, blood cultures show no growth to date. No repeat CXR since 3/4. Pt afebrile, WBC trending down. On regular HD schedule and tolerating at a little slower BFR -- will plan to check a pre-HD Vancomycin level prior to HD on Sat, 3/8.  Goal of Therapy:  Pre-HD Vancomycin level of 15-25  mcg/ml Proper antibiotics for infection/cultures adjusted for renal/hepatic function   Plan:  1. Cont Vancomycin 750 mg post-HD sessions on T/Th/Sat 2. Cont Levaquin 500 mg IV every 48 hours 3. Will obtain a pre-HD Vancomycin level on Sat, 3/8 and follow-up with any Vancomycin adjustments at that time 4. Will continue to follow HD schedule/duration, culture results, LOT, and antibiotic de-escalation plans   Georgina Pillion, PharmD, BCPS Clinical Pharmacist Pager: [phone] 08/12/2012 12:54 PM

## 2012-08-13 LAB — RENAL FUNCTION PANEL
BUN: 63 mg/dL — ABNORMAL HIGH (ref 6–23)
CO2: 26 mEq/L (ref 19–32)
Chloride: 94 mEq/L — ABNORMAL LOW (ref 96–112)
Glucose, Bld: 95 mg/dL (ref 70–99)
Phosphorus: 2.7 mg/dL (ref 2.3–4.6)
Potassium: 3.5 mEq/L (ref 3.5–5.1)

## 2012-08-13 LAB — GLUCOSE, CAPILLARY

## 2012-08-13 LAB — CBC
HCT: 28.4 % — ABNORMAL LOW (ref 39.0–52.0)
Hemoglobin: 9.3 g/dL — ABNORMAL LOW (ref 13.0–17.0)
MCHC: 32.7 g/dL (ref 30.0–36.0)
RBC: 3.26 MIL/uL — ABNORMAL LOW (ref 4.22–5.81)

## 2012-08-13 MED ORDER — LEVOFLOXACIN 250 MG PO TABS: 250.0000 mg | ORAL_TABLET | Freq: Every day | ORAL | Status: DC

## 2012-08-13 MED ORDER — DOXERCALCIFEROL 4 MCG/2ML IV SOLN
INTRAVENOUS | Status: AC
  Filled 2012-08-13: qty 4

## 2012-08-13 NOTE — Discharge Summary (Signed)
Physician Discharge Summary  Jonathan Padilla ZOX:[id number] DOB: [birth year] DOA: 08/09/2012  PCP: No primary provider on file.  Admit date: 08/09/2012 Discharge date: 08/13/2012  Time spent: 30 minutes  Recommendations for Outpatient Follow-up:  1. Follow up for  Hemodialysis.   Discharge Diagnoses:    HCAP (healthcare-associated pneumonia)   HYPERTENSION, UNSPECIFIED   AORTIC INSUFFICIENCY   Congestive heart failure, unspecified   Second degree Mobitz II AV block   ESRD (end stage renal disease) on dialysis   Acute and chronic respiratory failure   Leukocytosis, unspecified   Discharge Condition: Stable.  Diet recommendation: renal diet.   Filed Weights   08/11/12 0750 08/11/12 1139 08/13/12 0646  Weight: 66.4 kg (146 lb 6.2 oz) 65.8 kg (145 lb 1 oz) 65.7 kg (144 lb 13.5 oz)    History of present illness:  Jonathan Padilla is an 79 y.o. male with hx of ESRD on HD (T, TH, Sat), Mobitz II s/p ppm, recent pacemaker generator replacement, HTN, CHF, PVD, CAD s/p prior MI, presents to the ER with one day hx of cough, shortness of breath and having bloody sputum. He denied any chest pain, fever, chills, nausea, or vomiting. He has no orthopnea or PND. Evaluation in the ER included a CXR which showed multifocal consolidation consistent with multifocal PNA. His K is 3.0, WBC of 12.5K, Hb of 11.5g/DL, Cr of 7.9. Hospitalist was asked to admit him for HCAP.   Hospital Course:  1-HCAP (healthcare-associated pneumonia)  Patient was admitted to hospital. He was started on  Vancomycin, levaquin and Zosyn. He received 5 days of IV antibiotics. He will be discharge on Levaquin for 5 more  Days.. WBC trending down. Afebrile. speech for swallow evaluation, moderate risk for aspiration. Marland Kitchen   2-Acute on Chronic Hypoxic Respiratory Failure  Patient become hypoxic, he was transfer to step down unit. His respiratory status improved with oxygen supplementation, dialysis.  -Multifactorial: PNA, Pulmonary  edema/COPD  -O2 as needed  -Improved, now on 2 L oxygen.  3-ESRD on HD  -Renal following.  -Dialysis 3-8  4-Dyslipidemia  -c/w Lipitor.  5-HTN  Episode of hypotension. Will hold Imdur. Holder parameters for Coreg. BP stable.  6-COPD  Nebs treatments.  7-H/o Second degree Mobitz II AV block  - PPM in situ-battery was just recently replaced  8-Aortoiliac occlusive disease  has scheduled outpatient appointment  9-Hypoglycemia: Hypoglycemia protocol. Encourage oral intake. Not on insulin.  10-Leukocytosis: Secondary to PNA. blood culture no growth to date. . Trending down.    Procedures:  none  Consultations:  Dr Ivor Messier.   Discharge Exam: Filed Vitals:   08/13/12 0702 08/13/12 0730 08/13/12 0800 08/13/12 0830  BP: 108/57 107/51 104/56 112/46  Pulse: 73 67 67 65  Temp:      TempSrc:      Resp: 18 18 16 16   Height:      Weight:      SpO2:        General: No distress. Cardiovascular: S 1, S 2 RRR Respiratory: Crackles bases.  Discharge Instructions  Discharge Orders   Future Appointments Provider Department Dept Phone   08/24/2012 9:00 AM Hillis Range, MD [place] [facility] [place] [phone]   Future Orders Complete By Expires     Diet - low sodium heart healthy  As directed     Increase activity slowly  As directed         Medication List    TAKE these medications       atorvastatin  40 MG tablet  Commonly known as:  LIPITOR  Take 40 mg by mouth daily.     carvedilol 6.25 MG tablet  Commonly known as:  COREG  Take 6.25 mg by mouth 2 (two) times daily with a meal.     colchicine 0.6 MG tablet  Take 0.6 mg by mouth daily as needed.     isosorbide mononitrate 30 MG 24 hr tablet  Commonly known as:  IMDUR  Take 30 mg by mouth daily.     levofloxacin 250 MG tablet  Commonly known as:  LEVAQUIN  Take 1 tablet (250 mg total) by mouth daily.     multivitamin Tabs tablet  Take 1 tablet by mouth daily.     pantoprazole 40 MG tablet   Commonly known as:  PROTONIX  Take 40 mg by mouth daily.     sevelamer 800 MG tablet  Commonly known as:  RENAGEL  Take 800 mg by mouth as directed. 4 with meals and 2 with snacks          The results of significant diagnostics from this hospitalization (including imaging, microbiology, ancillary and laboratory) are listed below for reference.    Significant Diagnostic Studies: Dg Chest 2 View  08/05/2012  *RADIOLOGY REPORT*  Clinical Data: Pacemaker generator exchange  CHEST - 2 VIEW  Comparison: The chest radiograph 03/27/2011  Findings: Right-sided pacemaker is in place with two continuous leads.  The pacing wires extend adjacent to the right subclavian stent.  There is a thoracic aortic stent graft which appears unchanged. No pneumothorax.  IMPRESSION: No complications following right pacer placement.N   Original Report Authenticated By: Genevive Bi, M.D.    Ct Abdomen Pelvis W Contrast  07/29/2012  *RADIOLOGY REPORT*  Clinical Data: Left-sided flank pain.  CT ABDOMEN AND PELVIS WITH CONTRAST  Technique:  Multidetector CT imaging of the abdomen and pelvis was performed following the standard protocol during bolus administration of intravenous contrast.  Contrast: OMNIPAQUE IOHEXOL 300 MG/ML  SOLN  Comparison: CT of the abdomen and pelvis 04/11/2005.  Findings:  Lung Bases: Scarring in the inferior aspect of the right lower and middle lobes.  Pacemaker wires in the right atrium and right ventricle.  Calcifications of the aortic valve, mitral valve and mitral annulus.  The distal end of a stent graft in the thoracic aorta is noted.  Distal thoracic aorta is aneurysmally dilated beneath the stent graft measuring up to 4.3 x 4.0 cm shortly above the level of the aortic hiatus.  Abdomen/Pelvis:  Irregularly shaped partially calcified gallstones are noted dependently within the gallbladder.  The gallbladder appears only moderately distended, however, there is diffuse gallbladder wall  thickening measuring up to 11 mm.  No frank pericholecystic fluid or stranding is noted.  The enhanced appearance of the liver, pancreas, spleen and bilateral adrenal glands is unremarkable.  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  Numerous calcifications are noted in the renal hila bilaterally, favored to represent vascular calcifications (less likely to represent nonobstructive calculi).  No hydronephrosis.  A moderate sized duodenal diverticulum from the third portion of the duodenum is incidentally noted.  Postoperative changes of the aortobi-iliac bypass graft are again noted.  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  There is eccentric nonenhancing material within the wall of the aneurysm favored to represent mural thrombus and/or atheromatous plaque.  There is no significant volume of ascites.  No pneumoperitoneum. No pathologic distension of small bowel.  No definite pathologic lymphadenopathy is identified within the abdomen or pelvis, although there are numerous reactive size borderline enlarged retroperitoneal lymph nodes noted.  Musculoskeletal: There are no aggressive appearing lytic or blastic lesions noted in the visualized portions of the skeleton. Prominent Schmorl's nodes in the inferior aspects of the L3-L4 vertebral bodies.  IMPRESSION: 1.  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 No current signs of active extravasation to suggest leaking aneurysm at this time. 2.   Cholelithiasis with moderate gallbladder distension and diffuse gallbladder wall thickening suggesting edema.  No overt signs of inflammation.  Overall, the examination is equivocal for acute cholecystitis.  Further evaluation with right upper quadrant ultrasound may be warranted if the patient is exhibiting signs or symptoms concerning for acute cholecystitis. 3.  Aneurysmal dilatation of the distal descending thoracic aorta below the level of the thoracic aortic stent graft, measuring up to 4.0 x 4.3 cm in diameter. 4.  Additional incidental findings, similar to prior examinations, as above.   Original Report Authenticated By: Trudie Reed, M.D.    Dg Chest Port 1 View  08/09/2012  *RADIOLOGY REPORT*  Clinical Data: Shortness of breath  PORTABLE CHEST - 1 VIEW  Comparison: Chest radiograph 08/09/2012  Findings: Right-sided pacemaker overlies stable cardiac silhouette. Vascular stent graft within the thoracic aorta appears well expanded.  There is air space disease within the right upper lobe which is increased in density compared to prior.  Bibasilar air space disease which is mild and similar.  IMPRESSION:  1.  Increase in density of right upper lobe pneumonia. 2.  Stable bibasilar airspace disease.   Original Report Authenticated By: Genevive Bi, M.D.    Dg Chest Port 1 View  08/09/2012  *RADIOLOGY REPORT*  Clinical Data: Shortness of breath and cough.  PORTABLE CHEST - 1 VIEW  Comparison: Chest radiograph performed 08/05/2012  Findings: There is new right midlung airspace opacification, compatible with multifocal pneumonia.  Mild left basilar opacity is also seen.  No definite pleural effusion or pneumothorax is seen.  The cardiomediastinal silhouette is borderline enlarged.  The patient is status post median sternotomy.  A vascular stent graft is noted along the thoracic aorta.  A pacemaker is seen overlying the right chest wall, with leads ending overlying the right atrium and right ventricle.  No  acute osseous abnormalities are seen.  IMPRESSION:  1.  New right midlung and left basilar airspace opacification, compatible with multifocal pneumonia. 2.  Borderline cardiomegaly.   Original Report Authenticated By: Tonia Ghent, M.D.     Microbiology: Recent Results (from the past 240 hour(s))  SURGICAL PCR SCREEN     Status: Abnormal   Collection Time    08/05/12  1:38 PM      Result Value Range Status   MRSA, PCR POSITIVE (*) NEGATIVE Final   Staphylococcus aureus POSITIVE (*) NEGATIVE Final   Comment:            The Xpert SA Assay (FDA     approved for NASAL specimens     in patients over 21  years of age),     is one component of     a comprehensive surveillance     program.  Test performance has     been validated by [employer] for patients greater     than or equal to 37 year old.     It is not intended     to diagnose infection nor to     guide or monitor treatment.  CULTURE, BLOOD (ROUTINE X 2)     Status: None   Collection Time    08/10/12 10:00 AM      Result Value Range Status   Specimen Description BLOOD ARM LEFT   Final   Special Requests BOTTLES DRAWN AEROBIC AND ANAEROBIC 5CC   Final   Culture  Setup Time 08/10/2012 17:05   Final   Culture     Final   Value:        BLOOD CULTURE RECEIVED NO GROWTH TO DATE CULTURE WILL BE HELD FOR 5 DAYS BEFORE ISSUING A FINAL NEGATIVE REPORT   Report Status PENDING   Incomplete  CULTURE, BLOOD (ROUTINE X 2)     Status: None   Collection Time    08/10/12 10:15 AM      Result Value Range Status   Specimen Description BLOOD HAND LEFT   Final   Special Requests BOTTLES DRAWN AEROBIC ONLY 5CC   Final   Culture  Setup Time 08/10/2012 17:05   Final   Culture     Final   Value:        BLOOD CULTURE RECEIVED NO GROWTH TO DATE CULTURE WILL BE HELD FOR 5 DAYS BEFORE ISSUING A FINAL NEGATIVE REPORT   Report Status PENDING   Incomplete     Labs: Basic Metabolic Panel:  Recent Labs Lab 08/09/12 0519 08/09/12 1010  08/10/12 0650 08/11/12 0430 08/13/12 0649  NA 140  --  141 139 139  K 3.0*  --  4.1 4.2 3.5  CL 103  --  96 96 94*  CO2  --   --  21 26 26   GLUCOSE 91  --  54* 102* 95  BUN 60*  --  45* 64* 63*  CREATININE 7.90* 9.20* 6.58* 7.78* 6.69*  CALCIUM  --   --  9.8 9.9 9.7  PHOS  --  3.8 3.6 2.1* 2.7   Liver Function Tests:  Recent Labs Lab 08/10/12 0650 08/11/12 0430 08/13/12 0649  ALBUMIN 3.3* 2.9* 2.4*   No results found for this basename: LIPASE, AMYLASE,  in the last 168 hours No results found for this basename: AMMONIA,  in the last 168 hours CBC:  Recent Labs Lab 08/09/12 0441 08/09/12 0519 08/09/12 1010 08/10/12 0650 08/11/12 0430 08/13/12 0649  WBC 8.1  --  17.2* 20.4* 14.3* 13.7*  NEUTROABS 7.8*  --   --   --  12.7*  --   HGB 11.5* 12.9* 11.3* 10.0* 9.6* 9.3*  HCT 37.0* 38.0* 36.6* 30.8* 28.8* 28.4*  MCV 91.4  --  92.2 88.3 87.5 87.1  PLT 190  --  189 145* 135* 133*   Cardiac Enzymes: No results found for this basename: CKTOTAL, CKMB, CKMBINDEX, TROPONINI,  in the last 168 hours BNP: BNP (last 3 results) No results found for this basename: PROBNP,  in the last 8760 hours CBG:  Recent Labs Lab 08/12/12 1237 08/12/12 1601 08/12/12 2018 08/13/12 0014 08/13/12 0444  GLUCAP 124* 104* 91 89 89  Signed:  REGALADO,BELKYS  [facility] 08/13/2012, 8:42 AM

## 2012-08-13 NOTE — Procedures (Signed)
Pt seen on HD.  Ap 130 Vp 200.  BFR 400.  He says he is going home today after HD?  OK to go from renal standpoint.  Pre Admission DW 72.5kg.  Will get standing DW at end of HD today.

## 2012-08-13 NOTE — Progress Notes (Signed)
CARE MANAGEMENT NOTE 08/13/2012  Patient:  Jonathan Padilla, Jonathan Padilla   Account Number:  [id number]  Date Initiated:  08/09/2012  Documentation initiated by:  Letha Cape  Subjective/Objective Assessment:   dx pna, resp distress  admit     Action/Plan:   CM spoke with patient's daughter-[place] concerning home health needs at discharge. Choice offered. Orders faxed to [facility].   Anticipated DC Date:  08/13/2012   Anticipated DC Plan:  HOME W HOME HEALTH SERVICES      DC Planning Services  CM consult      Choice offered to / List presented to:          [facility] arranged  HH-2 PT      [facility] agency  [facility]   Status of service:  Completed, signed off Medicare Important Message given?   (If response is "NO", the following Medicare IM given date fields will be blank) Date Medicare IM given:   Date Additional Medicare IM given:    Discharge Disposition:  HOME W HOME HEALTH SERVICES  Per UR Regulation:  Reviewed for med. necessity/level of care/duration of stay

## 2012-08-13 NOTE — Progress Notes (Addendum)
Pt discharged to home after visit summary reviewed and pt capable of re verbalizing medications and follow up appointments. Pt remains stable. No signs and symptoms of distress. Educated to return to ER in the event of SOB, dizziness, chest pain, or fainting. Pt's HH PT was set up with [facility] and his daughter notified. Laverda Sorenson, RN

## 2012-08-16 LAB — CULTURE, BLOOD (ROUTINE X 2): Culture: NO GROWTH

## 2012-08-24 ENCOUNTER — Encounter: Payer: Medicare Other | Admitting: Internal Medicine

## 2012-08-25 ENCOUNTER — Ambulatory Visit (INDEPENDENT_AMBULATORY_CARE_PROVIDER_SITE_OTHER): Payer: Medicare Other | Admitting: General Practice

## 2012-08-25 VITALS — BP 98/51 | HR 93 | Temp 96.8°F | Wt 155.0 lb

## 2012-08-25 DIAGNOSIS — IMO0001 Reserved for inherently not codable concepts without codable children: Secondary | ICD-10-CM

## 2012-08-25 DIAGNOSIS — J322 Chronic ethmoidal sinusitis: Secondary | ICD-10-CM

## 2012-08-25 DIAGNOSIS — J00 Acute nasopharyngitis [common cold]: Secondary | ICD-10-CM

## 2012-08-25 MED ORDER — AZITHROMYCIN 250 MG PO TABS: ORAL_TABLET | ORAL | Status: DC

## 2012-08-25 NOTE — Patient Instructions (Addendum)
Viral Syndrome You or your child has Viral Syndrome. It is the most common infection causing "colds" and infections in the nose, throat, sinuses, and breathing tubes. Sometimes the infection causes nausea, vomiting, or diarrhea. The germ that causes the infection is a virus. No antibiotic or other medicine will kill it. There are medicines that you can take to make you or your child more comfortable.  HOME CARE INSTRUCTIONS   Rest in bed until you start to feel better.  If you have diarrhea or vomiting, eat small amounts of crackers and toast. Soup is helpful.  Do not give aspirin or medicine that contains aspirin to children.  Only take over-the-counter or prescription medicines for pain, discomfort, or fever as directed by your caregiver. SEEK IMMEDIATE MEDICAL CARE IF:   You or your child has not improved within one week.  You or your child has pain that is not at least partially relieved by over-the-counter medicine.  Thick, colored mucus or blood is coughed up.  Discharge from the nose becomes thick yellow or green.  Diarrhea or vomiting gets worse.  There is any major change in your or your child's condition.  You or your child develops a skin rash, stiff neck, severe headache, or are unable to hold down food or fluid.  You or your child has an oral temperature above 102 F (38.9 C), not controlled by medicine.  Your baby is older than 3 months with a rectal temperature of 102 F (38.9 C) or higher.  Your baby is 45 months old or younger with a rectal temperature of 100.4 F (38 C) or higher. Document Released: 05/10/2006 Document Revised: 08/17/2011 Document Reviewed: 05/11/2007 [facility] Patient Information 2013 [place], [place]. Sinusitis Sinusitis is redness, soreness, and swelling (inflammation) of the paranasal sinuses. Paranasal sinuses are air pockets within the bones of your face (beneath the eyes, the middle of the forehead, or above the eyes). In healthy paranasal  sinuses, mucus is able to drain out, and air is able to circulate through them by way of your nose. However, when your paranasal sinuses are inflamed, mucus and air can become trapped. This can allow bacteria and other germs to grow and cause infection. Sinusitis can develop quickly and last only a short time (acute) or continue over a long period (chronic). Sinusitis that lasts for more than 12 weeks is considered chronic.  CAUSES  Causes of sinusitis include:  Allergies.  Structural abnormalities, such as displacement of the cartilage that separates your nostrils (deviated septum), which can decrease the air flow through your nose and sinuses and affect sinus drainage.  Functional abnormalities, such as when the small hairs (cilia) that line your sinuses and help remove mucus do not work properly or are not present. SYMPTOMS  Symptoms of acute and chronic sinusitis are the same. The primary symptoms are pain and pressure around the affected sinuses. Other symptoms include:  Upper toothache.  Earache.  Headache.  Bad breath.  Decreased sense of smell and taste.  A cough, which worsens when you are lying flat.  Fatigue.  Fever.  Thick drainage from your nose, which often is green and may contain pus (purulent).  Swelling and warmth over the affected sinuses. DIAGNOSIS  Your caregiver will perform a physical exam. During the exam, your caregiver may:  Look in your nose for signs of abnormal growths in your nostrils (nasal polyps).  Tap over the affected sinus to check for signs of infection.  View the inside of your sinuses (endoscopy)  with a special imaging device with a light attached (endoscope), which is inserted into your sinuses. If your caregiver suspects that you have chronic sinusitis, one or more of the following tests may be recommended:  Allergy tests.  Nasal culture A sample of mucus is taken from your nose and sent to a lab and screened for bacteria.  Nasal  cytology A sample of mucus is taken from your nose and examined by your caregiver to determine if your sinusitis is related to an allergy. TREATMENT  Most cases of acute sinusitis are related to a viral infection and will resolve on their own within 10 days. Sometimes medicines are prescribed to help relieve symptoms (pain medicine, decongestants, nasal steroid sprays, or saline sprays).  However, for sinusitis related to a bacterial infection, your caregiver will prescribe antibiotic medicines. These are medicines that will help kill the bacteria causing the infection.  Rarely, sinusitis is caused by a fungal infection. In theses cases, your caregiver will prescribe antifungal medicine. For some cases of chronic sinusitis, surgery is needed. Generally, these are cases in which sinusitis recurs more than 3 times per year, despite other treatments. HOME CARE INSTRUCTIONS   Drink plenty of water. Water helps thin the mucus so your sinuses can drain more easily.  Use a humidifier.  Inhale steam 3 to 4 times a day (for example, sit in the bathroom with the shower running).  Apply a warm, moist washcloth to your face 3 to 4 times a day, or as directed by your caregiver.  Use saline nasal sprays to help moisten and clean your sinuses.  Take over-the-counter or prescription medicines for pain, discomfort, or fever only as directed by your caregiver. SEEK IMMEDIATE MEDICAL CARE IF:  You have increasing pain or severe headaches.  You have nausea, vomiting, or drowsiness.  You have swelling around your face.  You have vision problems.  You have a stiff neck.  You have difficulty breathing. MAKE SURE YOU:   Understand these instructions.  Will watch your condition.  Will get help right away if you are not doing well or get worse. Document Released: 05/25/2005 Document Revised: 08/17/2011 Document Reviewed: 06/09/2011 [facility] Patient Information 2013 [place], [place].

## 2012-08-25 NOTE — Progress Notes (Signed)
  Subjective:    Patient ID: Jonathan Padilla, male    DOB: Apr 05, 1928, 79 y.o.   MRN: [id number]  Headache  This is a new problem. The current episode started today. The problem occurs intermittently. The pain is located in the bilateral and frontal region. The pain does not radiate. The pain quality is not similar to prior headaches. The quality of the pain is described as aching. The pain is mild. Associated symptoms include rhinorrhea and sinus pressure. Pertinent negatives include no abdominal pain, back pain, dizziness, ear pain, eye pain, scalp tenderness or sore throat. Nothing aggravates the symptoms. He has tried acetaminophen for the symptoms. The treatment provided significant relief. There is no history of cluster headaches, hypertension or migraine headaches.   Patient reports headache and stomach ache, began while taking dialysis treatment today. Reports stomach ache is gone, but headache is gone.    Review of Systems  Constitutional: Negative for appetite change and fatigue.  HENT: Positive for rhinorrhea and sinus pressure. Negative for ear pain, congestion and sore throat.   Eyes: Negative for pain.  Respiratory: Negative for chest tightness and shortness of breath.   Cardiovascular: Negative for chest pain.  Gastrointestinal: Negative for abdominal pain, diarrhea and abdominal distention.  Genitourinary: Negative for dysuria and difficulty urinating.  Musculoskeletal: Negative for back pain.  Skin: Negative for rash.  Neurological: Negative for dizziness and headaches.  Psychiatric/Behavioral: Negative.        Objective:   Physical Exam  Constitutional: He appears well-developed and well-nourished.  Eyes: Pupils are equal, round, and reactive to light.  Cardiovascular: Normal rate, regular rhythm and normal heart sounds.   No murmur heard. Pulmonary/Chest: Effort normal and breath sounds normal. No respiratory distress.  Abdominal: Soft. Bowel sounds are normal.   Neurological: He is alert.  Skin: Skin is warm and dry. No rash noted.  Psychiatric: He has a normal mood and affect.          Assessment & Plan:  Sinusitis Viral cold Complete full course of antibiotic Proper hand hygiene  Raymon Mutton, FNP-C

## 2012-08-26 ENCOUNTER — Telehealth: Payer: Self-pay | Admitting: *Deleted

## 2012-08-26 NOTE — Telephone Encounter (Signed)
Pt- Jonathan Padilla:  Had a fall this am to his knee this morning- just fyi- pt ok- no complications  Zithromycin that was given yesterday came up as an adverse reaction with colcrys.  Please advise  Call pt if any changes need to be made.

## 2012-08-26 NOTE — Telephone Encounter (Signed)
Tell  Jonathan Padilla to hold Lipitor while he is taking the Zithromax

## 2012-08-30 ENCOUNTER — Encounter: Payer: Self-pay | Admitting: General Practice

## 2012-08-30 ENCOUNTER — Other Ambulatory Visit: Payer: Self-pay | Admitting: General Practice

## 2012-08-30 DIAGNOSIS — K8020 Calculus of gallbladder without cholecystitis without obstruction: Secondary | ICD-10-CM

## 2012-08-30 DIAGNOSIS — R109 Unspecified abdominal pain: Secondary | ICD-10-CM

## 2012-08-30 NOTE — Telephone Encounter (Signed)
Please address

## 2012-08-30 NOTE — Telephone Encounter (Signed)
Patient aware to stop lipitor.. Has gallstones want referral to GI

## 2012-08-30 NOTE — Telephone Encounter (Signed)
Please inform patient that a referral has been made to [facility] for gallstones. thx

## 2012-08-31 ENCOUNTER — Telehealth: Payer: Self-pay | Admitting: *Deleted

## 2012-08-31 NOTE — Telephone Encounter (Signed)
Spoke with Jonathan Padilla this morning. He stated that he was seen at [facility] on Monday and they check him out thoroughly. They told him he did not have gallstones and he does not wish to have a referral at this time.

## 2012-08-31 NOTE — Telephone Encounter (Signed)
Spoke with Mr Jonathan Padilla and he stated that he was seen at [facility] on Monday and they checked everything out on him. They told him he didn't have any gallstones. He does not want a referral at this time.

## 2012-09-01 ENCOUNTER — Other Ambulatory Visit: Payer: Self-pay | Admitting: General Practice

## 2012-09-01 DIAGNOSIS — K8020 Calculus of gallbladder without cholecystitis without obstruction: Secondary | ICD-10-CM

## 2012-09-01 DIAGNOSIS — R109 Unspecified abdominal pain: Secondary | ICD-10-CM

## 2012-09-02 ENCOUNTER — Encounter: Payer: Self-pay | Admitting: Internal Medicine

## 2012-09-02 ENCOUNTER — Other Ambulatory Visit: Payer: Self-pay | Admitting: Internal Medicine

## 2012-09-02 ENCOUNTER — Ambulatory Visit (INDEPENDENT_AMBULATORY_CARE_PROVIDER_SITE_OTHER): Payer: Medicare Other | Admitting: *Deleted

## 2012-09-02 DIAGNOSIS — I441 Atrioventricular block, second degree: Secondary | ICD-10-CM

## 2012-09-02 LAB — PACEMAKER DEVICE OBSERVATION
AL IMPEDENCE PM: 487 Ohm
ATRIAL PACING PM: 0
BATTERY VOLTAGE: 2.8 V
RV LEAD AMPLITUDE: 15.68 mv
VENTRICULAR PACING PM: 4

## 2012-09-02 NOTE — Progress Notes (Signed)
Dual chamber pacemaker wound check. Normal device function. Site well healed with no redness or swelling. ROV in 3 mths w/JA in [place] office.

## 2012-09-05 DIAGNOSIS — I120 Hypertensive chronic kidney disease with stage 5 chronic kidney disease or end stage renal disease: Secondary | ICD-10-CM

## 2012-09-05 DIAGNOSIS — N186 End stage renal disease: Secondary | ICD-10-CM

## 2012-09-05 DIAGNOSIS — I509 Heart failure, unspecified: Secondary | ICD-10-CM

## 2012-09-05 DIAGNOSIS — IMO0001 Reserved for inherently not codable concepts without codable children: Secondary | ICD-10-CM

## 2012-09-07 ENCOUNTER — Encounter (INDEPENDENT_AMBULATORY_CARE_PROVIDER_SITE_OTHER): Payer: Self-pay | Admitting: General Surgery

## 2012-09-08 ENCOUNTER — Ambulatory Visit (INDEPENDENT_AMBULATORY_CARE_PROVIDER_SITE_OTHER): Payer: Medicare Other | Admitting: General Surgery

## 2012-09-09 ENCOUNTER — Other Ambulatory Visit: Payer: Self-pay | Admitting: *Deleted

## 2012-09-09 MED ORDER — ISOSORBIDE MONONITRATE ER 30 MG PO TB24: 30.0000 mg | ORAL_TABLET | Freq: Every day | ORAL | Status: DC

## 2012-09-09 MED ORDER — PANTOPRAZOLE SODIUM 40 MG PO TBEC: 40.0000 mg | DELAYED_RELEASE_TABLET | Freq: Every day | ORAL | Status: DC

## 2012-09-09 MED ORDER — CARVEDILOL 6.25 MG PO TABS: 6.2500 mg | ORAL_TABLET | Freq: Two times a day (BID) | ORAL | Status: DC

## 2012-09-16 ENCOUNTER — Encounter: Payer: Medicare Other | Admitting: Internal Medicine

## 2012-09-30 ENCOUNTER — Ambulatory Visit (INDEPENDENT_AMBULATORY_CARE_PROVIDER_SITE_OTHER): Payer: Medicare Other | Admitting: General Surgery

## 2012-10-05 ENCOUNTER — Ambulatory Visit: Payer: Self-pay | Admitting: Nurse Practitioner

## 2012-10-13 ENCOUNTER — Ambulatory Visit (INDEPENDENT_AMBULATORY_CARE_PROVIDER_SITE_OTHER): Payer: Medicare Other | Admitting: General Surgery

## 2012-10-21 ENCOUNTER — Other Ambulatory Visit: Payer: Self-pay | Admitting: *Deleted

## 2012-10-21 ENCOUNTER — Ambulatory Visit (INDEPENDENT_AMBULATORY_CARE_PROVIDER_SITE_OTHER): Payer: Medicare Other | Admitting: General Surgery

## 2012-10-21 MED ORDER — LOSARTAN POTASSIUM 100 MG PO TABS: 100.0000 mg | ORAL_TABLET | Freq: Every day | ORAL | Status: DC

## 2012-10-27 ENCOUNTER — Encounter: Payer: Self-pay | Admitting: Internal Medicine

## 2012-10-27 ENCOUNTER — Ambulatory Visit (INDEPENDENT_AMBULATORY_CARE_PROVIDER_SITE_OTHER): Payer: Medicare Other | Admitting: Internal Medicine

## 2012-10-27 VITALS — BP 107/61 | HR 83 | Ht 66.0 in | Wt 154.0 lb

## 2012-10-27 DIAGNOSIS — I441 Atrioventricular block, second degree: Secondary | ICD-10-CM

## 2012-10-27 DIAGNOSIS — I2510 Atherosclerotic heart disease of native coronary artery without angina pectoris: Secondary | ICD-10-CM | POA: Insufficient documentation

## 2012-10-27 DIAGNOSIS — I25810 Atherosclerosis of coronary artery bypass graft(s) without angina pectoris: Secondary | ICD-10-CM

## 2012-10-27 LAB — PACEMAKER DEVICE OBSERVATION
AL AMPLITUDE: 4 mv
AL THRESHOLD: 0.5 V
BAMS-0001: 155 {beats}/min
RV LEAD AMPLITUDE: 15.68 mv

## 2012-10-27 NOTE — Progress Notes (Signed)
PCP: Bennie Pierini, FNP  The patient presents today for routine electrophysiology followup.  Since his recent pacemaker generator change, the patient reports doing well.  He was admitted 3/14 with pneumonia but has done well since.  Today, he denies symptoms of palpitations, chest pain, shortness of breath, orthopnea, PND, lower extremity edema, dizziness, presyncope, or syncope.  The patient feels that he is tolerating medications without difficulties and is otherwise without complaint today.   Past Medical History  Diagnosis Date  . Hyperlipidemia   . Hypertension   . Congestive heart failure, unspecified   . Coronary atherosclerosis of native coronary artery   . 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 End stage renal disease     on HD MWF  . Aortoiliac occlusive disease   . Secondary hyperparathyroidism   . Gout   . MI (myocardial infarction)   . Hemodialysis patient 1999  . Shortness of breath   . Pneumonia 08/09/2012   Past Surgical History  Procedure Laterality Date  . Coronary artery bypass graft  04/18/05    Rachell Cipro  . Ppm  11/18/05    Medtronic EnRhythm P1501-complete heart block  . Dialysis fistula creation    . Pacemaker generator change  08/05/12    MDT Adaptal L generator placd by Dr Johney Frame    Current Outpatient Prescriptions  Medication Sig Dispense Refill  . atorvastatin (LIPITOR) 40 MG tablet Take 40 mg by mouth daily.      . carvedilol (COREG) 6.25 MG tablet Take 1 tablet (6.25 mg total) by mouth 2 (two) times daily with a meal.  60 tablet  2  . colchicine 0.6 MG tablet Take 0.6 mg by mouth daily as needed.      . isosorbide mononitrate (IMDUR) 30 MG 24 hr tablet Take 1 tablet (30 mg total) by mouth daily.  30 tablet  3  . multivitamin (RENA-VIT) TABS tablet Take 1 tablet by mouth daily.      . pantoprazole (PROTONIX) 40 MG tablet Take 1 tablet (40 mg total) by mouth daily.  30 tablet  3  . sevelamer (RENAGEL) 800 MG  tablet Take 800 mg by mouth as directed. 5 tabs twice a day       No current facility-administered medications for this visit.    No Known Allergies  History   Social History  . Marital Status: Widowed    Spouse Name: N/A    Number of Children: N/A  . Years of Education: N/A   Occupational History  . Not on file.   Social History Main Topics  . Smoking status: Former Smoker -- 1.00 packs/day for 50 years    Types: Cigarettes    Quit date: 06/09/1995  . Smokeless tobacco: Never Used  . Alcohol Use: No  . Drug Use: No  . Sexually Active: Not on file   Other Topics Concern  . Not on file   Social History Narrative  . No narrative on file    Physical Exam: Filed Vitals:   10/27/12 1453  BP: 107/61  Pulse: 83  Height: 5\' 6"  (1.676 m)  Weight: 154 lb (69.854 kg)  SpO2: 97%    GEN- The patient is well appearing, alert and oriented x 3 today.   Head- normocephalic, atraumatic Eyes-  Sclera clear, conjunctiva pink Ears- hearing intact Oropharynx- clear Neck- supple  Lungs- Clear to ausculation bilaterally, normal work of breathing Chest- pacemaker pocket is well healed Heart- Regular rate  and rhythm  GI- soft, NT, ND, + BS Extremities- no clubbing, cyanosis, or edema  Pacemaker interrogation- reviewed in detail today,  See PACEART report  Assessment and Plan:  1. Mobitz II second degree AV block Normal pacemaker function See Pace Art report No changes today  2. CAD Stable without ischemic symptoms No change required today  Return 2/15

## 2012-10-27 NOTE — Patient Instructions (Addendum)
Continue all current medications. Allred - 07/2013

## 2012-11-11 ENCOUNTER — Ambulatory Visit (INDEPENDENT_AMBULATORY_CARE_PROVIDER_SITE_OTHER): Payer: Medicare Other | Admitting: General Surgery

## 2012-12-13 ENCOUNTER — Ambulatory Visit (INDEPENDENT_AMBULATORY_CARE_PROVIDER_SITE_OTHER): Payer: Medicare Other | Admitting: General Surgery

## 2012-12-28 ENCOUNTER — Ambulatory Visit (INDEPENDENT_AMBULATORY_CARE_PROVIDER_SITE_OTHER): Payer: Medicare Other | Admitting: General Surgery

## 2013-01-09 ENCOUNTER — Ambulatory Visit (INDEPENDENT_AMBULATORY_CARE_PROVIDER_SITE_OTHER): Payer: Medicare Other | Admitting: General Practice

## 2013-01-09 ENCOUNTER — Telehealth: Payer: Self-pay | Admitting: Nurse Practitioner

## 2013-01-09 ENCOUNTER — Encounter: Payer: Self-pay | Admitting: General Practice

## 2013-01-09 VITALS — BP 131/69 | HR 86 | Temp 98.4°F | Ht 66.0 in | Wt 154.5 lb

## 2013-01-09 DIAGNOSIS — J069 Acute upper respiratory infection, unspecified: Secondary | ICD-10-CM

## 2013-01-09 MED ORDER — AZITHROMYCIN 250 MG PO TABS: ORAL_TABLET | ORAL | Status: DC

## 2013-01-09 NOTE — Progress Notes (Signed)
  Subjective:    Patient ID: Jonathan Padilla, male    DOB: 1928-04-21, 79 y.o.   MRN: [id number]  HPI Patient presents today with complaints of coughing, sneezing, runny nose since Wednesday after mowing yard. Reports this has happened before after mowing yard. He reports that he felt short of breath yesterday. He denies shortness of breath today. Reports a productive cough, yellowish secretions.     Review of Systems  Constitutional: Negative for fever and chills.  HENT: Negative for congestion, sore throat and sinus pressure.   Respiratory: Positive for shortness of breath. Negative for cough and chest tightness.        Reports wearing oxygen all day yesterday at 3 liters  Cardiovascular: Negative for chest pain and palpitations.  Genitourinary: Negative for difficulty urinating.  Neurological: Negative for dizziness, weakness and headaches.       Objective:   Physical Exam  Constitutional: He is oriented to person, place, and time. He appears well-developed and well-nourished.  HENT:  Head: Normocephalic and atraumatic.  Right Ear: External ear normal.  Left Ear: External ear normal.  Nose: Rhinorrhea present.  Mouth/Throat: Posterior oropharyngeal erythema present.  Neck: Normal range of motion. Neck supple. No thyromegaly present.  Cardiovascular: Normal rate, regular rhythm and normal heart sounds.   Pulmonary/Chest: Effort normal and breath sounds normal. No respiratory distress. He exhibits no tenderness.  Lymphadenopathy:    He has no cervical adenopathy.  Neurological: He is alert and oriented to person, place, and time.  Skin: Skin is warm and dry.  Psychiatric: He has a normal mood and affect.          Assessment & Plan:  1. Acute upper respiratory infections of unspecified site - azithromycin (ZITHROMAX) 250 MG tablet; Take as directed  Dispense: 6 tablet; Refill: 0 -proper hand hygiene -RTO if symptoms worsen or unresolved -Patient and caregiver verbalized  understanding -Coralie Keens, FNP-C

## 2013-01-09 NOTE — Patient Instructions (Addendum)

## 2013-01-09 NOTE — Telephone Encounter (Signed)
appt made

## 2013-01-30 ENCOUNTER — Telehealth: Payer: Self-pay | Admitting: Nurse Practitioner

## 2013-01-30 NOTE — Telephone Encounter (Signed)
Knee pain and swelling.  Has had fluid drained form it before. 1st available with regular provider, Jonathan Padilla, isn't until 8/29.  He prefers to see someone sooner. Appt scheduled.  Patient aware.

## 2013-01-31 ENCOUNTER — Encounter: Payer: Self-pay | Admitting: General Practice

## 2013-01-31 ENCOUNTER — Ambulatory Visit (INDEPENDENT_AMBULATORY_CARE_PROVIDER_SITE_OTHER): Payer: Medicare Other | Admitting: General Practice

## 2013-01-31 VITALS — BP 129/62 | HR 92 | Temp 97.4°F | Ht 66.0 in | Wt 155.0 lb

## 2013-01-31 DIAGNOSIS — M109 Gout, unspecified: Secondary | ICD-10-CM

## 2013-01-31 MED ORDER — COLCHICINE 0.6 MG PO TABS: 0.6000 mg | ORAL_TABLET | Freq: Every day | ORAL | Status: DC | PRN

## 2013-01-31 NOTE — Progress Notes (Signed)
  Subjective:    Patient ID: Jonathan Padilla, male    DOB: 22-Feb-1928, 79 y.o.   MRN: [id number]  HPI Presents today with complaints of right knee pain. Reports onset was yesterday and has a history of gout. Reports taking colchicine and pain is less today. Reports able to ambulate better since taking medication.     Review of Systems  Constitutional: Negative for fever and chills.  Respiratory: Negative for chest tightness and shortness of breath.   Cardiovascular: Negative for chest pain and palpitations.  Genitourinary:       Anuric, taking dialysis  Musculoskeletal:       Right knee pain  Neurological: Negative for dizziness, weakness and headaches.       Objective:   Physical Exam  Constitutional: He is oriented to person, place, and time. He appears well-developed and well-nourished.  Cardiovascular: Normal rate, regular rhythm and normal heart sounds.   Pulmonary/Chest: Effort normal and breath sounds normal.  Musculoskeletal: He exhibits tenderness. He exhibits no edema.  Mild tenderness noted to right knee, negative swelling.   Neurological: He is alert and oriented to person, place, and time.  Skin: Skin is warm and dry.  Psychiatric: He has a normal mood and affect.          Assessment & Plan:  1. Gout - colchicine 0.6 MG tablet; Take 1 tablet (0.6 mg total) by mouth daily as needed.  Dispense: 30 tablet; Refill: 3 -continue medication as directed -may use ice or heat, which ever is most effective -RTO if symptoms worsen -Patient verbalized understanding -Coralie Keens, FNP-C

## 2013-01-31 NOTE — Patient Instructions (Signed)
Gout  Gout is an inflammatory condition (arthritis) caused by a buildup of uric acid crystals in the joints. Uric acid is a chemical that is normally present in the blood. Under some circumstances, uric acid can form into crystals in your joints. This causes joint redness, soreness, and swelling (inflammation). Repeat attacks are common. Over time, uric acid crystals can form into masses (tophi) near a joint, causing disfigurement. Gout is treatable and often preventable.  CAUSES   The disease begins with elevated levels of uric acid in the blood. Uric acid is produced by your body when it breaks down a naturally found substance called purines. This also happens when you eat certain foods such as meats and fish. Causes of an elevated uric acid level include:   Being passed down from parent to child (heredity).   Diseases that cause increased uric acid production (obesity, psoriasis, some cancers).   Excessive alcohol use.   Diet, especially diets rich in meat and seafood.   Medicines, including certain cancer-fighting drugs (chemotherapy), diuretics, and aspirin.   Chronic kidney disease. The kidneys are no longer able to remove uric acid well.   Problems with metabolism.  Conditions strongly associated with gout include:   Obesity.   High blood pressure.   High cholesterol.   Diabetes.  Not everyone with elevated uric acid levels gets gout. It is not understood why some people get gout and others do not. Surgery, joint injury, and eating too much of certain foods are some of the factors that can lead to gout.  SYMPTOMS    An attack of gout comes on quickly. It causes intense pain with redness, swelling, and warmth in a joint.   Fever can occur.   Often, only one joint is involved. Certain joints are more commonly involved:   Base of the big toe.   Knee.   Ankle.   Wrist.   Finger.  Without treatment, an attack usually goes away in a few days to weeks. Between attacks, you usually will not have  symptoms, which is different from many other forms of arthritis.  DIAGNOSIS   Your caregiver will suspect gout based on your symptoms and exam. Removal of fluid from the joint (arthrocentesis) is done to check for uric acid crystals. Your caregiver will give you a medicine that numbs the area (local anesthetic) and use a needle to remove joint fluid for exam. Gout is confirmed when uric acid crystals are seen in joint fluid, using a special microscope. Sometimes, blood, urine, and X-ray tests are also used.  TREATMENT   There are 2 phases to gout treatment: treating the sudden onset (acute) attack and preventing attacks (prophylaxis).  Treatment of an Acute Attack   Medicines are used. These include anti-inflammatory medicines or steroid medicines.   An injection of steroid medicine into the affected joint is sometimes necessary.   The painful joint is rested. Movement can worsen the arthritis.   You may use warm or cold treatments on painful joints, depending which works best for you.   Discuss the use of coffee, vitamin C, or cherries with your caregiver. These may be helpful treatment options.  Treatment to Prevent Attacks  After the acute attack subsides, your caregiver may advise prophylactic medicine. These medicines either help your kidneys eliminate uric acid from your body or decrease your uric acid production. You may need to stay on these medicines for a very long time.  The early phase of treatment with prophylactic medicine can be associated   with an increase in acute gout attacks. For this reason, during the first few months of treatment, your caregiver may also advise you to take medicines usually used for acute gout treatment. Be sure you understand your caregiver's directions.  You should also discuss dietary treatment with your caregiver. Certain foods such as meats and fish can increase uric acid levels. Other foods such as dairy can decrease levels. Your caregiver can give you a list of foods  to avoid.  HOME CARE INSTRUCTIONS    Do not take aspirin to relieve pain. This raises uric acid levels.   Only take over-the-counter or prescription medicines for pain, discomfort, or fever as directed by your caregiver.   Rest the joint as much as possible. When in bed, keep sheets and blankets off painful areas.   Keep the affected joint raised (elevated).   Use crutches if the painful joint is in your leg.   Drink enough water and fluids to keep your urine clear or pale yellow. This helps your body get rid of uric acid. Do not drink alcoholic beverages. They slow the passage of uric acid.   Follow your caregiver's dietary instructions. Pay careful attention to the amount of protein you eat. Your daily diet should emphasize fruits, vegetables, whole grains, and fat-free or low-fat milk products.   Maintain a healthy body weight.  SEEK MEDICAL CARE IF:    You have an oral temperature above 102 F (38.9 C).   You develop diarrhea, vomiting, or any side effects from medicines.   You do not feel better in 24 hours, or you are getting worse.  SEEK IMMEDIATE MEDICAL CARE IF:    Your joint becomes suddenly more tender and you have:   Chills.   An oral temperature above 102 F (38.9 C), not controlled by medicine.  MAKE SURE YOU:    Understand these instructions.   Will watch your condition.   Will get help right away if you are not doing well or get worse.  Document Released: 05/22/2000 Document Revised: 08/17/2011 Document Reviewed: 09/02/2009  [employer] Patient Information 2014 [employer]

## 2013-07-20 ENCOUNTER — Encounter: Payer: Self-pay | Admitting: *Deleted

## 2013-08-07 ENCOUNTER — Ambulatory Visit: Payer: Medicare Other | Admitting: General Practice

## 2013-08-08 ENCOUNTER — Other Ambulatory Visit: Payer: Self-pay

## 2013-08-08 MED ORDER — CARVEDILOL 6.25 MG PO TABS: 6.2500 mg | ORAL_TABLET | Freq: Two times a day (BID) | ORAL | Status: DC

## 2013-08-08 NOTE — Telephone Encounter (Signed)
Last seen 01/31/13  Jonathan Padilla

## 2013-08-08 NOTE — Telephone Encounter (Signed)
Last seen 01/31/13  Mae 

## 2013-08-09 ENCOUNTER — Ambulatory Visit (INDEPENDENT_AMBULATORY_CARE_PROVIDER_SITE_OTHER): Payer: Medicare Other | Admitting: General Practice

## 2013-08-09 ENCOUNTER — Encounter: Payer: Self-pay | Admitting: *Deleted

## 2013-08-09 ENCOUNTER — Encounter: Payer: Self-pay | Admitting: General Practice

## 2013-08-09 VITALS — BP 104/46 | HR 82 | Temp 97.8°F | Ht 66.0 in | Wt 157.0 lb

## 2013-08-09 DIAGNOSIS — Z992 Dependence on renal dialysis: Secondary | ICD-10-CM

## 2013-08-09 DIAGNOSIS — Z0000 Encounter for general adult medical examination without abnormal findings: Secondary | ICD-10-CM

## 2013-08-09 DIAGNOSIS — E785 Hyperlipidemia, unspecified: Secondary | ICD-10-CM

## 2013-08-09 DIAGNOSIS — K219 Gastro-esophageal reflux disease without esophagitis: Secondary | ICD-10-CM

## 2013-08-09 DIAGNOSIS — N186 End stage renal disease: Secondary | ICD-10-CM

## 2013-08-09 DIAGNOSIS — M109 Gout, unspecified: Secondary | ICD-10-CM

## 2013-08-09 NOTE — Patient Instructions (Signed)

## 2013-08-09 NOTE — Progress Notes (Signed)
   Subjective:    Patient ID: Jonathan Padilla, male    DOB: 01/26/1928, 80 y.o.   MRN: [id number]  HPI Patient presents today for annual exam. He is accompanied by his caregiver. History of end stage renal disease, CAD, acid reflux, gout, CHF, hypertension and hyperlipidemia. Hemodialysis 3 times weekly. Taking medications as prescribed. Denies any complaints at this time.     Review of Systems  Constitutional: Negative for fever and chills.  Respiratory: Negative for chest tightness and shortness of breath.   Cardiovascular: Negative for chest pain and palpitations.  Gastrointestinal: Negative for abdominal pain, diarrhea, constipation and blood in stool.  Genitourinary:       Anuric  Neurological: Negative for dizziness, weakness and headaches.       Objective:   Physical Exam  Constitutional: He appears well-developed and well-nourished.  HENT:  Head: Normocephalic and atraumatic.  Right Ear: External ear normal.  Left Ear: External ear normal.  Mouth/Throat: Oropharynx is clear and moist.  Neck: Normal range of motion. Neck supple. No thyromegaly present.  Cardiovascular: Normal rate.   Pulmonary/Chest: Effort normal and breath sounds normal. No respiratory distress. He exhibits no tenderness.  Lymphadenopathy:    He has no cervical adenopathy.  Neurological: He is alert.  Skin: Skin is warm and dry.  Psychiatric: He has a normal mood and affect.          Assessment & Plan:  1. Annual physical exam   2. Gout   3. GERD (gastroesophageal reflux disease)   4. ESRD on dialysis   5. Hyperlipidemia -Continue all current medications Copy of labs to be faxed from nephrology F/u in 3 months Discussed benefits of regular exercise and healthy eating Patient and caregiver verbalized understanding Coralie KeensMae E. [place] Ndiaye, FNP-C

## 2013-08-18 ENCOUNTER — Telehealth: Payer: Self-pay | Admitting: Nurse Practitioner

## 2013-08-21 ENCOUNTER — Telehealth: Payer: Self-pay | Admitting: Nurse Practitioner

## 2013-08-21 NOTE — Telephone Encounter (Signed)
He saw Mae- She will need to address

## 2013-08-21 NOTE — Telephone Encounter (Signed)
Patient is having cramps and wants something for that

## 2013-08-21 NOTE — Telephone Encounter (Signed)
He is having cramps in his left hand and saw you last week and forgot to tell you please advise

## 2013-08-22 ENCOUNTER — Telehealth: Payer: Self-pay | Admitting: General Practice

## 2013-08-26 ENCOUNTER — Other Ambulatory Visit: Payer: Self-pay | Admitting: General Practice

## 2013-08-28 NOTE — Telephone Encounter (Signed)
Spoke with patient and addressing medication.

## 2013-08-29 NOTE — Telephone Encounter (Signed)
Cramps can occur due to dialysis. Patient should make sure nephrologist is aware. Pharmacy had no record of medication used for cramps in the past.

## 2013-08-30 NOTE — Telephone Encounter (Signed)
Lm for pt to call neph about the cramps

## 2013-09-18 ENCOUNTER — Other Ambulatory Visit: Payer: Self-pay | Admitting: Nurse Practitioner

## 2013-10-09 ENCOUNTER — Encounter: Payer: Self-pay | Admitting: Internal Medicine

## 2013-10-09 ENCOUNTER — Encounter: Payer: Medicare Other | Admitting: Internal Medicine

## 2013-11-15 ENCOUNTER — Other Ambulatory Visit: Payer: Self-pay | Admitting: General Practice

## 2013-11-21 ENCOUNTER — Telehealth: Payer: Self-pay | Admitting: General Practice

## 2013-11-21 NOTE — Telephone Encounter (Signed)
appt scheduled

## 2013-11-23 ENCOUNTER — Ambulatory Visit: Payer: Medicare Other | Admitting: Nurse Practitioner

## 2013-11-27 ENCOUNTER — Encounter: Payer: Self-pay | Admitting: Internal Medicine

## 2013-11-27 ENCOUNTER — Ambulatory Visit (INDEPENDENT_AMBULATORY_CARE_PROVIDER_SITE_OTHER): Payer: Medicare Other | Admitting: Internal Medicine

## 2013-11-27 VITALS — BP 150/74 | HR 71 | Ht 72.0 in | Wt 160.0 lb

## 2013-11-27 DIAGNOSIS — I441 Atrioventricular block, second degree: Secondary | ICD-10-CM

## 2013-11-27 DIAGNOSIS — I10 Essential (primary) hypertension: Secondary | ICD-10-CM

## 2013-11-27 LAB — MDC_IDC_ENUM_SESS_TYPE_INCLINIC
Brady Statistic AP VP Percent: 1 %
Brady Statistic AP VS Percent: 1 %
Brady Statistic AS VP Percent: 28 %
Brady Statistic AS VS Percent: 71 %
Lead Channel Impedance Value: 436 Ohm
Lead Channel Impedance Value: 515 Ohm
Lead Channel Pacing Threshold Amplitude: 0.5 V
Lead Channel Pacing Threshold Pulse Width: 0.4 ms
Lead Channel Sensing Intrinsic Amplitude: 2 mV
Lead Channel Sensing Intrinsic Amplitude: 5.6 mV
Lead Channel Setting Pacing Amplitude: 2 V
MDC IDC MSMT BATTERY IMPEDANCE: 113 Ohm
MDC IDC MSMT BATTERY REMAINING LONGEVITY: 151 mo
MDC IDC MSMT BATTERY VOLTAGE: 2.79 V
MDC IDC MSMT LEADCHNL RV PACING THRESHOLD AMPLITUDE: 1 V
MDC IDC MSMT LEADCHNL RV PACING THRESHOLD PULSEWIDTH: 0.4 ms
MDC IDC SESS DTM: 20150622083824
MDC IDC SET LEADCHNL RV PACING AMPLITUDE: 2.5 V
MDC IDC SET LEADCHNL RV PACING PULSEWIDTH: 0.4 ms
MDC IDC SET LEADCHNL RV SENSING SENSITIVITY: 2 mV

## 2013-11-27 NOTE — Progress Notes (Signed)
PCP: Rudi HeapMOORE, DONALD, MD  The patient presents today for routine electrophysiology followup.  He is doing well and remains active.  Today, he denies symptoms of palpitations, chest pain, shortness of breath, orthopnea, PND, lower extremity edema, dizziness, presyncope, or syncope. He reports that dialysis is also going well.  The patient feels that he is tolerating medications without difficulties and is otherwise without complaint today.   Past Medical History  Diagnosis Date  . Hyperlipidemia   . Hypertension   . Congestive heart failure, unspecified   . Coronary atherosclerosis of native coronary artery   . 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 End stage renal disease     on HD MWF  . Aortoiliac occlusive disease   . Secondary hyperparathyroidism   . Gout   . MI (myocardial infarction)   . Hemodialysis patient 1999  . Shortness of breath   . Pneumonia 08/09/2012   Past Surgical History  Procedure Laterality Date  . Coronary artery bypass graft  04/18/05    Rachell CiproEdward Gerhardt,MD  . Ppm  11/18/05    Medtronic EnRhythm P1501-complete heart block  . Dialysis fistula creation    . Pacemaker generator change  08/05/12    MDT Adaptal L generator placd by Dr Johney FrameAllred    Current Outpatient Prescriptions  Medication Sig Dispense Refill  . atorvastatin (LIPITOR) 40 MG tablet Take 40 mg by mouth daily.      . carvedilol (COREG) 6.25 MG tablet Take 1 tablet (6.25 mg total) by mouth 2 (two) times daily with a meal.  60 tablet  3  . COLCRYS 0.6 MG tablet TAKE ONE TABLET BY MOUTH DAILY AS NEEDED  30 tablet  2  . guaifenesin (HUMIBID E) 400 MG TABS tablet Take 400 mg by mouth daily.      . isosorbide mononitrate (IMDUR) 30 MG 24 hr tablet TAKE ONE (1) TABLET EACH DAY  30 tablet  2  . LOSARTAN POTASSIUM PO Take 100 mg by mouth daily.      . multivitamin (RENA-VIT) TABS tablet Take 1 tablet by mouth daily.      . pantoprazole (PROTONIX) 40 MG tablet TAKE ONE (1) TABLET EACH  DAY  30 tablet  5  . sevelamer (RENAGEL) 800 MG tablet Take 800 mg by mouth as directed. 5 tabs once a day       No current facility-administered medications for this visit.    No Known Allergies  History   Social History  . Marital Status: Widowed    Spouse Name: N/A    Number of Children: N/A  . Years of Education: N/A   Occupational History  . Not on file.   Social History Main Topics  . Smoking status: Former Smoker -- 1.00 packs/day for 50 years    Types: Cigarettes    Quit date: 06/09/1995  . Smokeless tobacco: Never Used  . Alcohol Use: No  . Drug Use: No  . Sexual Activity: Not on file   Other Topics Concern  . Not on file   Social History Narrative  . No narrative on file    Physical Exam: Filed Vitals:   11/27/13 0819  BP: 150/74  Pulse: 71  Height: 6' (1.829 m)  Weight: 160 lb (72.576 kg)    GEN- The patient is well appearing, alert and oriented x 3 today.   Head- normocephalic, atraumatic Eyes-  Sclera clear, conjunctiva pink Ears- hearing intact Oropharynx- clear Neck- supple  Lungs-  Clear to ausculation bilaterally, normal work of breathing Chest- pacemaker pocket is well healed Heart- Regular rate and rhythm  GI- soft, NT, ND, + BS Extremities- no clubbing, cyanosis, or edema  Pacemaker interrogation- reviewed in detail today,  See PACEART report  Assessment and Plan:  1. Mobitz II second degree AV block Normal pacemaker function See Pace Art report No changes today  2. CAD Stable without ischemic symptoms No change required today PCP to follow lipids/ lfts  3. HTN Stable No change required today  carelink Return to see me in 1 year

## 2013-11-27 NOTE — Patient Instructions (Signed)
   Carelink check 02/28/14 at home. Your physician recommends that you schedule a follow-up appointment in: 1 year with Dr. Johney FrameAllred. You will receive a reminder letter in the mail in about 10 months reminding you to call and schedule your appointment. If you don't receive this letter, please contact our office. Your physician recommends that you continue on your current medications as directed. Please refer to the Current Medication list given to you today.

## 2013-12-11 ENCOUNTER — Other Ambulatory Visit: Payer: Self-pay | Admitting: General Practice

## 2013-12-18 ENCOUNTER — Ambulatory Visit: Payer: Medicare Other | Admitting: Nurse Practitioner

## 2013-12-20 ENCOUNTER — Telehealth: Payer: Self-pay | Admitting: General Practice

## 2013-12-20 NOTE — Telephone Encounter (Signed)
appt given  

## 2013-12-22 ENCOUNTER — Encounter: Payer: Self-pay | Admitting: Nurse Practitioner

## 2013-12-22 ENCOUNTER — Ambulatory Visit (INDEPENDENT_AMBULATORY_CARE_PROVIDER_SITE_OTHER): Payer: Medicare Other | Admitting: Nurse Practitioner

## 2013-12-22 VITALS — BP 102/60 | HR 58 | Temp 98.1°F | Ht 72.0 in | Wt 156.0 lb

## 2013-12-22 DIAGNOSIS — E785 Hyperlipidemia, unspecified: Secondary | ICD-10-CM

## 2013-12-22 DIAGNOSIS — H6121 Impacted cerumen, right ear: Secondary | ICD-10-CM

## 2013-12-22 DIAGNOSIS — H6120 Impacted cerumen, unspecified ear: Secondary | ICD-10-CM

## 2013-12-22 DIAGNOSIS — R630 Anorexia: Secondary | ICD-10-CM | POA: Insufficient documentation

## 2013-12-22 NOTE — Progress Notes (Signed)
   Subjective:    Patient ID: Jonathan Padilla, male    DOB: Oct 28, 1927, 80 y.o.   MRN: [id number]  HPI Patient is here today for loss of appetite and weight loss per his daughter. He is a dialysis patient [facility]. According to our record he lost 1lb in the past 3 months. Patient is accompanied by his caregiver today.    Review of Systems  Constitutional: Negative.   HENT: Negative.   Eyes: Negative.   Respiratory: Negative.   Cardiovascular: Negative.   Gastrointestinal: Negative.   Endocrine: Negative.   Genitourinary: Negative.   Allergic/Immunologic: Negative.   Neurological: Negative.   Hematological: Negative.   Psychiatric/Behavioral: Negative.        Objective:   Physical Exam  Constitutional: He is oriented to person, place, and time. He appears well-developed and well-nourished.  HENT:  Head: Normocephalic.  Right Ear: External ear normal.  Left Ear: External ear normal.  Nose: Nose normal.  Mouth/Throat: Oropharynx is clear and moist.  Right ear cerumen impaction   Eyes: EOM are normal. Pupils are equal, round, and reactive to light.  Neck: Normal range of motion. Neck supple. No JVD present. No thyromegaly present.  Cardiovascular: Normal rate, regular rhythm, normal heart sounds and intact distal pulses.  Exam reveals no gallop and no friction rub.   No murmur heard. Pulmonary/Chest: Effort normal and breath sounds normal. No respiratory distress. He has no wheezes. He has no rales. He exhibits no tenderness.  Abdominal: Soft. Bowel sounds are normal. He exhibits no mass. There is no tenderness.  Genitourinary: Prostate normal and penis normal.  Musculoskeletal: Normal range of motion. He exhibits no edema.  Walks with a cane.   Lymphadenopathy:    He has no cervical adenopathy.  Neurological: He is alert and oriented to person, place, and time. No cranial nerve deficit.  Skin: Skin is warm and dry.  Psychiatric: He has a normal mood and affect. His behavior is  normal. Judgment and thought content normal.   BP 102/60  Pulse 58  Temp(Src) 98.1 F (36.7 C) (Oral)  Ht 6' (1.829 m)  Wt 156 lb (70.761 kg)  BMI 21.15 kg/m2        Assessment & Plan:  1. Appetite loss Add ensure to daily diet - Thyroid Panel With TSH - BMP8+EGFR  2. HYPERLIPIDEMIA-MIXED Watch fats in diet - NMR, lipoprofile  3. Cerumen impaction, right Debrox OTC  RTO prn  Mary-Margaret Hassell Done, FNP

## 2013-12-22 NOTE — Patient Instructions (Signed)
Food Basics for Chronic Kidney Disease When your kidneys are not working well, they cannot remove waste and excess substances from your blood as effectively as they did before. This can lead to a buildup and imbalance of these substances, which can affect how your body functions. This buildup can also make your kidneys work harder, causing even more damage. You may need to eat less of certain foods that can lead to the buildup of these substances in your body. By making the changes to your diet that are recommended by your dietitian or health care provider, you could possibly help prevent further kidney damage and delay or prevent the need for dialysis. The following information can help give you a basic understanding of these substances and how they affect your bodily functions. The information also gives examples of foods that contain the highest amounts of these substances. WHAT DO I NEED TO KNOW ABOUT SUBSTANCES IN MY FOOD THAT I MAY NEED TO ADJUST? Food adjustments will be different for each person with chronic kidney disease. It is important for you to see a dietitian to help you determine the specific adjustments that you will need to make for each of the following substances. Potassium Potassium affects how steadily your heart beats. If too much potassium builds up in your blood, it can cause an irregular heartbeat or even a heart attack. Examples of foods rich in potassium include:  Milk.  Fruits.  Vegetables. Phosphorus Phosphorus is a mineral found in your bones. A balance between calcium and phosphorous is needed to build and maintain healthy bones. Too much phosphorus pulls calcium from your bones. This can make your bones weak and more likely to break. Too much phosphorus can also make your skin itch. Examples of foods rich in phosphorus include:  Milk and cheese.  Dried beans.  Peas.  Colas.  Nuts and peanut butter. Animal Protein Animal protein helps you make and keep  muscle. It also helps in the repair of your body's cells and tissues. One of the natural breakdown products of protein is a waste product called urea. When your kidneys are not working properly, they cannot remove wastes such as urea like they did before you developed chronic kidney disease. You will likely need to limit the amount of protein you eat to help prevent a buildup of urea in your blood. Examples of animal protein include:  Meat (all types).  Fish and seafood.  Poultry.  Eggs. Sodium Sodium, which is found in salt, helps maintain a healthy balance of fluids in your body. Too much sodium can increase your blood pressure level and have a negative affect on the function of your heart and lungs. Too much sodium also can cause your body to retain too much fluid, making your kidneys work harder. Examples of foods with high levels of sodium include:  Salt seasonings.  Soy sauce.  Cured and processed meats.  Salted crackers and snack foods.  Fast food.  Canned soups and most canned foods. Glucose Glucose provides energy for your body. If you have diabetes mellitus that is not properly controlled, you have too much glucose in your blood. Too much glucose in your blood can worsen the function of your kidneys by damaging small blood vessels. This prevents enough blood flow to your kidneys to give them what they need to work. If you have diabetes mellitus and chronic kidney disease, it is important to maintain your blood glucose at a level recommended by your health care provider. SHOULD I  TAKE A VITAMIN AND MINERAL SUPPLEMENT? Because you may need to avoid eating certain foods, you may not get all of the vitamins and minerals that would normally come from those foods. Your health care provider or dietitian may recommend that you take a supplement to ensure that you get all of the vitamins and minerals that your body needs.  Document Released: 08/15/2002 Document Revised: 05/30/2013  Document Reviewed: 04/21/2013 [facility] Patient Information 2015 [place]. This information is not intended to replace advice given to you by your health care provider. Make sure you discuss any questions you have with your health care provider.

## 2014-01-09 ENCOUNTER — Telehealth: Payer: Self-pay | Admitting: Nurse Practitioner

## 2014-01-11 NOTE — Telephone Encounter (Signed)
What do they want to change him to?p

## 2014-01-11 NOTE — Telephone Encounter (Signed)
Needs some type of supplement- can we try boost since he is not a diabetic

## 2014-01-11 NOTE — Telephone Encounter (Signed)
The Ensure is causing diarrhea every time he drinks it.  They didn't have any suggestions but didn't want to remove this from his budget it he needs it. Should he try a different drink, like Boost, or would you suggest something like Imodium with the Ensure?

## 2014-01-12 NOTE — Telephone Encounter (Signed)
Called case worker and changed to [employer] InsuranceBoost

## 2014-01-19 ENCOUNTER — Other Ambulatory Visit: Payer: Self-pay

## 2014-01-19 MED ORDER — COLCHICINE 0.6 MG PO TABS: ORAL_TABLET | ORAL | Status: DC

## 2014-01-22 ENCOUNTER — Other Ambulatory Visit: Payer: Self-pay | Admitting: Nurse Practitioner

## 2014-01-30 ENCOUNTER — Telehealth: Payer: Self-pay | Admitting: Nurse Practitioner

## 2014-01-30 MED ORDER — ISOSORBIDE MONONITRATE ER 30 MG PO TB24: ORAL_TABLET | ORAL | Status: DC

## 2014-01-30 MED ORDER — CARVEDILOL 6.25 MG PO TABS: ORAL_TABLET | ORAL | Status: DC

## 2014-01-30 NOTE — Telephone Encounter (Signed)
done

## 2014-01-31 ENCOUNTER — Ambulatory Visit: Payer: Medicare Other | Admitting: *Deleted

## 2014-01-31 VITALS — BP 167/86 | HR 92 | Temp 96.5°F | Ht 72.0 in | Wt 154.0 lb

## 2014-02-28 ENCOUNTER — Encounter: Payer: Medicare Other | Admitting: *Deleted

## 2014-02-28 ENCOUNTER — Telehealth: Payer: Self-pay | Admitting: Cardiology

## 2014-02-28 NOTE — Telephone Encounter (Signed)
Confirmed remote transmission 

## 2014-03-01 ENCOUNTER — Encounter: Payer: Self-pay | Admitting: Cardiology

## 2014-04-09 ENCOUNTER — Telehealth: Payer: Self-pay | Admitting: Family Medicine

## 2014-04-09 NOTE — Telephone Encounter (Signed)
Feet swelling- appointment scheduled for wed with Paulene FloorMary Martin, FNP. Patient was offered appointment for tomorrow but was unable to come tomorrow due to dialysis. Patient was told to elevated legs as much as possible.

## 2014-04-11 ENCOUNTER — Encounter: Payer: Self-pay | Admitting: Nurse Practitioner

## 2014-04-11 ENCOUNTER — Ambulatory Visit (INDEPENDENT_AMBULATORY_CARE_PROVIDER_SITE_OTHER): Payer: Medicare Other | Admitting: Nurse Practitioner

## 2014-04-11 VITALS — BP 156/80 | HR 88 | Temp 96.9°F | Ht 69.0 in | Wt 160.0 lb

## 2014-04-11 DIAGNOSIS — Z992 Dependence on renal dialysis: Secondary | ICD-10-CM

## 2014-04-11 DIAGNOSIS — N186 End stage renal disease: Secondary | ICD-10-CM

## 2014-04-11 DIAGNOSIS — R600 Localized edema: Secondary | ICD-10-CM

## 2014-04-11 NOTE — Progress Notes (Signed)
   Subjective:    Patient ID: Jonathan Padilla, male    DOB: 07/05/1927, 80 y.o.   MRN: [id number]  HPI  Patient is here today complaining of bilateral foot edema that started 5 days ago. He is a dialysis patient, dialysis T, TH , SAT. He denies any numbness and tingling. He is not taking any diuretics.     Review of Systems  Musculoskeletal:       Bilateral foot edema.   All other systems reviewed and are negative.      Objective:   Physical Exam  Constitutional: He is oriented to person, place, and time. He appears well-developed and well-nourished.  HENT:  Head: Normocephalic and atraumatic.  Eyes: Conjunctivae and EOM are normal. Pupils are equal, round, and reactive to light.  Neck: Normal range of motion.  Cardiovascular: Normal rate and regular rhythm.   Pulmonary/Chest: Effort normal and breath sounds normal.  Musculoskeletal: Normal range of motion. He exhibits edema (bilateral foot.).  Neurological: He is alert and oriented to person, place, and time. He has normal reflexes.  Skin: Skin is warm.  Psychiatric: He has a normal mood and affect. His behavior is normal. Judgment and thought content normal.   BP 156/80 mmHg  Pulse 88  Temp(Src) 96.9 F (36.1 C) (Oral)  Ht 5\' 9"  (1.753 m)  Wt 160 lb (72.576 kg)  BMI 23.62 kg/m2        Assessment & Plan:   1. Edema of foot   2. ESRD (end stage renal disease) on dialysis    Elevate legs when sitting Let dialysis center know about swelling Follow up with cardiology  Mary-Margaret Daphine DeutscherMartin, FNP

## 2014-04-11 NOTE — Patient Instructions (Signed)

## 2014-04-17 ENCOUNTER — Telehealth: Payer: Self-pay | Admitting: Family Medicine

## 2014-04-17 ENCOUNTER — Encounter: Payer: Self-pay | Admitting: Cardiology

## 2014-05-15 ENCOUNTER — Other Ambulatory Visit: Payer: Self-pay | Admitting: General Practice

## 2014-05-17 ENCOUNTER — Encounter (HOSPITAL_COMMUNITY): Payer: Self-pay | Admitting: Internal Medicine

## 2014-06-12 ENCOUNTER — Other Ambulatory Visit: Payer: Self-pay | Admitting: Nurse Practitioner

## 2014-07-04 ENCOUNTER — Other Ambulatory Visit: Payer: Self-pay | Admitting: Nurse Practitioner

## 2014-07-06 ENCOUNTER — Other Ambulatory Visit: Payer: Self-pay | Admitting: Nurse Practitioner

## 2014-07-16 ENCOUNTER — Ambulatory Visit (INDEPENDENT_AMBULATORY_CARE_PROVIDER_SITE_OTHER): Payer: Medicare Other | Admitting: Family Medicine

## 2014-07-16 ENCOUNTER — Encounter: Payer: Self-pay | Admitting: Family Medicine

## 2014-07-16 VITALS — BP 120/70 | HR 87 | Temp 96.8°F | Ht 69.0 in | Wt 156.0 lb

## 2014-07-16 DIAGNOSIS — R252 Cramp and spasm: Secondary | ICD-10-CM

## 2014-07-16 DIAGNOSIS — T148XXA Other injury of unspecified body region, initial encounter: Secondary | ICD-10-CM

## 2014-07-16 DIAGNOSIS — J069 Acute upper respiratory infection, unspecified: Secondary | ICD-10-CM

## 2014-07-16 DIAGNOSIS — T148 Other injury of unspecified body region: Secondary | ICD-10-CM

## 2014-07-16 MED ORDER — DOXYCYCLINE HYCLATE 100 MG PO TABS: 100.0000 mg | ORAL_TABLET | Freq: Two times a day (BID) | ORAL | Status: DC

## 2014-07-16 MED ORDER — CYCLOBENZAPRINE HCL 5 MG PO TABS: 5.0000 mg | ORAL_TABLET | Freq: Three times a day (TID) | ORAL | Status: DC | PRN

## 2014-07-16 NOTE — Progress Notes (Signed)
   Subjective:    Patient ID: Jonathan Padilla, male    DOB: 08/17/1927, 81 y.o.   MRN: [id number]  HPI Patient is here for c/o uri.  He has been having occasional cramps in his LE's.  He has hx of ESRD and is on dialysis.  He has stubbed his right toe and c/o discomfort.  Review of Systems  Constitutional: Negative for fever.  HENT: Negative for ear pain.   Eyes: Negative for discharge.  Respiratory: Negative for cough.   Cardiovascular: Negative for chest pain.  Gastrointestinal: Negative for abdominal distention.  Endocrine: Negative for polyuria.  Genitourinary: Negative for difficulty urinating.  Musculoskeletal: Negative for gait problem and neck pain.  Skin: Negative for color change and rash.  Neurological: Negative for speech difficulty and headaches.  Psychiatric/Behavioral: Negative for agitation.       Objective:    BP 120/70 mmHg  Pulse 87  Temp(Src) 96.8 F (36 C) (Oral)  Ht 5\' 9"  (1.753 m)  Wt 156 lb (70.761 kg)  BMI 23.03 kg/m2 Physical Exam  Constitutional: He is oriented to person, place, and time. He appears well-developed and well-nourished.  HENT:  Head: Normocephalic and atraumatic.  Mouth/Throat: Oropharynx is clear and moist.  Eyes: Pupils are equal, round, and reactive to light.  Neck: Normal range of motion. Neck supple.  Cardiovascular: Normal rate and regular rhythm.   No murmur heard. Pulmonary/Chest: Effort normal and breath sounds normal.  Abdominal: Soft. Bowel sounds are normal. There is no tenderness.  Neurological: He is alert and oriented to person, place, and time.  Skin: Skin is warm and dry.  Psychiatric: He has a normal mood and affect.          Assessment & Plan:     ICD-9-CM ICD-10-CM   1. URI (upper respiratory infection) 465.9 J06.9 doxycycline (VIBRA-TABS) 100 MG tablet  2. Abrasion 919.0 T14.8 doxycycline (VIBRA-TABS) 100 MG tablet  3. Cramp of both lower extremities 729.82 R25.2 cyclobenzaprine (FLEXERIL) 5 MG tablet       No Follow-up on file.  Deatra CanterWilliam J Joanny Dupree FNP

## 2014-08-03 ENCOUNTER — Other Ambulatory Visit: Payer: Self-pay | Admitting: Nurse Practitioner

## 2014-09-04 ENCOUNTER — Ambulatory Visit (INDEPENDENT_AMBULATORY_CARE_PROVIDER_SITE_OTHER): Payer: Medicare Other | Admitting: Physician Assistant

## 2014-09-04 ENCOUNTER — Ambulatory Visit (INDEPENDENT_AMBULATORY_CARE_PROVIDER_SITE_OTHER): Payer: Medicare Other

## 2014-09-04 ENCOUNTER — Encounter: Payer: Self-pay | Admitting: Physician Assistant

## 2014-09-04 VITALS — BP 154/76 | HR 94 | Temp 96.6°F | Ht 69.0 in | Wt 156.0 lb

## 2014-09-04 DIAGNOSIS — R609 Edema, unspecified: Secondary | ICD-10-CM | POA: Diagnosis not present

## 2014-09-04 DIAGNOSIS — M79671 Pain in right foot: Secondary | ICD-10-CM

## 2014-09-04 DIAGNOSIS — R600 Localized edema: Secondary | ICD-10-CM

## 2014-09-04 NOTE — Patient Instructions (Addendum)
Wear compression hose ( knee high). [facility] or other drug store. Put on prior to getting out of bed in the morning.   Follow up with Cardiologist. Also discuss swelling with dialysis center.   Keep feet elevated when sitting

## 2014-09-04 NOTE — Progress Notes (Signed)
   Subjective:    Patient ID: Jonathan Padilla, male    DOB: 04/12/1928, 81 y.o.   MRN: [id number]  HPI 81 y/o with ESRD presents with swollen right foot x 2 days. Denies trauma. Was scheduled for dialysis this am but missed appt to come to appointment. Patient states that he has a history of gout. Has not been taking gout medication due to cost.     Review of Systems  Constitutional: Negative.   Genitourinary:       Patient states that he stopped urinating when he started dialysis 20 years ago.   Musculoskeletal:       Right foot pain and edema  Skin: Negative.        Objective:   Physical Exam  Constitutional: He is oriented to person, place, and time.  Musculoskeletal: He exhibits edema (right foot, more prominant on dorsal surface) and tenderness (dorsal surface near toes).  Neurological: He is alert and oriented to person, place, and time.  Vitals reviewed.         Assessment & Plan:  1. Right foot edema: xray negative for fracture. Patient failed to f/u with Cardiology as suggested on visit in Nov 2015 for same symptoms. Advised patient once again to f/u with Cardiologist and discuss swelling with dialysis center. Instructed patient that it is important to keep dialysis appointments. Also suggested OTC compression hose for LE edema. Keep feet elevated when sitting. Watch salt intake  2. ESRD: Continue dialysis

## 2014-09-11 ENCOUNTER — Telehealth: Payer: Self-pay

## 2014-09-11 NOTE — Telephone Encounter (Signed)
Cyclobenzaprine HCL approved 06/13/14 to 09/11/15 [facility]

## 2014-09-14 ENCOUNTER — Telehealth: Payer: Self-pay | Admitting: Nurse Practitioner

## 2014-09-14 MED ORDER — COLCRYS 0.6 MG PO TABS: 0.6000 mg | ORAL_TABLET | Freq: Every day | ORAL | Status: DC

## 2014-09-14 NOTE — Addendum Note (Signed)
Addended by: Gwenith DailyHUDY, Imanni Burdine N on: 09/14/2014 04:36 PM   Modules accepted: Orders

## 2014-09-14 NOTE — Telephone Encounter (Signed)
Patient should have at least one remaining [facility]  Left this information on voicemail.

## 2014-09-14 NOTE — Telephone Encounter (Signed)
Medicaid will pay for namebrand Colcrys but not generic. Namebrand sent in DAW with no refills.  Daughter aware.

## 2014-09-19 ENCOUNTER — Telehealth: Payer: Self-pay | Admitting: Nurse Practitioner

## 2014-09-25 NOTE — Telephone Encounter (Signed)
I spoke with Jonathan Padilla and patient went to see Dr. Ulice Brilliantrake and he is putting him on IV antibiotics, compression hose and increasing his gout medication. Patient has a wound on his toe and caregiver aware if no better by Monday to call us so we can get patient in to be seen or if it gets worse we need to see them asap.

## 2014-09-28 ENCOUNTER — Encounter: Payer: Self-pay | Admitting: Physician Assistant

## 2014-09-28 ENCOUNTER — Ambulatory Visit (INDEPENDENT_AMBULATORY_CARE_PROVIDER_SITE_OTHER): Payer: Medicare Other | Admitting: Physician Assistant

## 2014-09-28 VITALS — BP 125/64 | HR 98 | Temp 97.9°F | Ht 69.0 in | Wt 152.0 lb

## 2014-09-28 DIAGNOSIS — S91109A Unspecified open wound of unspecified toe(s) without damage to nail, initial encounter: Secondary | ICD-10-CM

## 2014-09-28 MED ORDER — MUPIROCIN 2 % EX OINT: TOPICAL_OINTMENT | CUTANEOUS | Status: DC

## 2014-09-28 NOTE — Progress Notes (Signed)
   Subjective:    Patient ID: Jonathan ApleyHenry S Padilla, male    DOB: 01/26/1928, 81 y.o.   MRN: [id number]  HPI 81 y/o male presents with c/o of right foot pain after hitting his toe last week. He has ESRD and goes for dialysis twice weekly. Chronic foot swelling and h/o gout but is unable to take medication d/t cost. He recently saw Dr. Ulice Brilliantrake for foot pain and [facility] Was advised to soak foot and given IV antibiotic with his dialysis. He will finish the antibiotic in 5 days.     Review of Systems  Musculoskeletal:       Right foot pain        Objective:   Physical Exam  Skin:  Ulcerative lesion and hematoma on right foot 3rd digit. Appears to be slightly necrotic Ulcer on medial great toe with purulent drainage.           Assessment & Plan:  . 1. Open wound of toe with complication, initial encounter  - mupirocin ointment (BACTROBAN) 2 %; Apply topical to wound on toe TID. Cover with nonstick gauze  Dispense: 22 g; Refill: 0 - Ambulatory referral to Wound Clinic   - Continue foot soaks as directed by Dr. Delphina Cahillrake   Glinda Natzke A. Chauncey ReadingGann PA-C

## 2014-09-30 LAB — AEROBIC CULTURE

## 2014-10-01 ENCOUNTER — Other Ambulatory Visit: Payer: Self-pay | Admitting: Physician Assistant

## 2014-10-01 ENCOUNTER — Encounter (HOSPITAL_COMMUNITY): Payer: Self-pay | Admitting: Emergency Medicine

## 2014-10-01 ENCOUNTER — Telehealth: Payer: Self-pay

## 2014-10-01 ENCOUNTER — Other Ambulatory Visit: Payer: Self-pay | Admitting: Family Medicine

## 2014-10-01 ENCOUNTER — Inpatient Hospital Stay (HOSPITAL_COMMUNITY)
Admission: EM | Admit: 2014-10-01 | Discharge: 2014-10-05 | DRG: 299 | Disposition: A | Discharge status: Home Health | Payer: Medicare Other | Attending: Family Medicine | Admitting: Family Medicine

## 2014-10-01 ENCOUNTER — Emergency Department (HOSPITAL_COMMUNITY): Payer: Medicare Other

## 2014-10-01 ENCOUNTER — Telehealth: Payer: Self-pay | Admitting: Physician Assistant

## 2014-10-01 DIAGNOSIS — I10 Essential (primary) hypertension: Secondary | ICD-10-CM | POA: Diagnosis present

## 2014-10-01 DIAGNOSIS — M79673 Pain in unspecified foot: Secondary | ICD-10-CM | POA: Diagnosis not present

## 2014-10-01 DIAGNOSIS — Z992 Dependence on renal dialysis: Secondary | ICD-10-CM

## 2014-10-01 DIAGNOSIS — R252 Cramp and spasm: Secondary | ICD-10-CM

## 2014-10-01 DIAGNOSIS — I120 Hypertensive chronic kidney disease with stage 5 chronic kidney disease or end stage renal disease: Secondary | ICD-10-CM | POA: Diagnosis present

## 2014-10-01 DIAGNOSIS — S99921S Unspecified injury of right foot, sequela: Secondary | ICD-10-CM

## 2014-10-01 DIAGNOSIS — Z7982 Long term (current) use of aspirin: Secondary | ICD-10-CM

## 2014-10-01 DIAGNOSIS — E785 Hyperlipidemia, unspecified: Secondary | ICD-10-CM | POA: Diagnosis present

## 2014-10-01 DIAGNOSIS — Z87891 Personal history of nicotine dependence: Secondary | ICD-10-CM

## 2014-10-01 DIAGNOSIS — I70268 Atherosclerosis of native arteries of extremities with gangrene, other extremity: Secondary | ICD-10-CM | POA: Diagnosis not present

## 2014-10-01 DIAGNOSIS — I2510 Atherosclerotic heart disease of native coronary artery without angina pectoris: Secondary | ICD-10-CM | POA: Diagnosis present

## 2014-10-01 DIAGNOSIS — I252 Old myocardial infarction: Secondary | ICD-10-CM

## 2014-10-01 DIAGNOSIS — X58XXXS Exposure to other specified factors, sequela: Secondary | ICD-10-CM | POA: Diagnosis present

## 2014-10-01 DIAGNOSIS — I441 Atrioventricular block, second degree: Secondary | ICD-10-CM | POA: Diagnosis present

## 2014-10-01 DIAGNOSIS — I509 Heart failure, unspecified: Secondary | ICD-10-CM | POA: Diagnosis present

## 2014-10-01 DIAGNOSIS — Z950 Presence of cardiac pacemaker: Secondary | ICD-10-CM

## 2014-10-01 DIAGNOSIS — M109 Gout, unspecified: Secondary | ICD-10-CM | POA: Diagnosis present

## 2014-10-01 DIAGNOSIS — N186 End stage renal disease: Secondary | ICD-10-CM

## 2014-10-01 DIAGNOSIS — Z79899 Other long term (current) drug therapy: Secondary | ICD-10-CM

## 2014-10-01 DIAGNOSIS — N2581 Secondary hyperparathyroidism of renal origin: Secondary | ICD-10-CM | POA: Diagnosis present

## 2014-10-01 DIAGNOSIS — R600 Localized edema: Secondary | ICD-10-CM

## 2014-10-01 DIAGNOSIS — Z951 Presence of aortocoronary bypass graft: Secondary | ICD-10-CM

## 2014-10-01 DIAGNOSIS — I96 Gangrene, not elsewhere classified: Secondary | ICD-10-CM | POA: Diagnosis present

## 2014-10-01 LAB — CBC WITH DIFFERENTIAL/PLATELET
Basophils Absolute: 0 10*3/uL (ref 0.0–0.1)
Basophils Relative: 0 % (ref 0–1)
EOS ABS: 0.2 10*3/uL (ref 0.0–0.7)
EOS PCT: 4 % (ref 0–5)
HCT: 30.2 % — ABNORMAL LOW (ref 39.0–52.0)
Hemoglobin: 9.8 g/dL — ABNORMAL LOW (ref 13.0–17.0)
Lymphocytes Relative: 19 % (ref 12–46)
Lymphs Abs: 1 10*3/uL (ref 0.7–4.0)
MCH: 28.8 pg (ref 26.0–34.0)
MCHC: 32.5 g/dL (ref 30.0–36.0)
MCV: 88.8 fL (ref 78.0–100.0)
Monocytes Absolute: 0.9 10*3/uL (ref 0.1–1.0)
Monocytes Relative: 16 % — ABNORMAL HIGH (ref 3–12)
NEUTROS PCT: 61 % (ref 43–77)
Neutro Abs: 3.3 10*3/uL (ref 1.7–7.7)
PLATELETS: 154 10*3/uL (ref 150–400)
RBC: 3.4 MIL/uL — ABNORMAL LOW (ref 4.22–5.81)
RDW: 15.4 % (ref 11.5–15.5)
WBC: 5.4 10*3/uL (ref 4.0–10.5)

## 2014-10-01 LAB — I-STAT CHEM 8, ED
BUN: 40 mg/dL — ABNORMAL HIGH (ref 6–23)
CHLORIDE: 97 mmol/L (ref 96–112)
Calcium, Ion: 0.93 mmol/L — ABNORMAL LOW (ref 1.13–1.30)
Creatinine, Ser: 11 mg/dL — ABNORMAL HIGH (ref 0.50–1.35)
Glucose, Bld: 85 mg/dL (ref 70–99)
HEMATOCRIT: 31 % — AB (ref 39.0–52.0)
Hemoglobin: 10.5 g/dL — ABNORMAL LOW (ref 13.0–17.0)
Potassium: 3.4 mmol/L — ABNORMAL LOW (ref 3.5–5.1)
SODIUM: 134 mmol/L — AB (ref 135–145)
TCO2: 22 mmol/L (ref 0–100)

## 2014-10-01 NOTE — ED Notes (Signed)
Dialysis on Tuesdays, Thursdays, and Saturdays. Missed dialysis on Saturday.

## 2014-10-01 NOTE — Telephone Encounter (Signed)
I will call wound center in am to discuss seeing him sooner.

## 2014-10-01 NOTE — ED Notes (Addendum)
Pt's family member st's pt hit his right foot approx 2-3 days ago.  St's now right third toe is infected.  Also has drainage coming from right great toe.  Pt is a dialysis pt but did not go on Sat.

## 2014-10-01 NOTE — ED Notes (Signed)
Pts family very upset about long wait time. pts family explained about process of triage and wait.

## 2014-10-01 NOTE — Telephone Encounter (Signed)
CNA just arrived from not being there on the weekend and toe is completely black   What can she do?   Referral May 4th for wound center

## 2014-10-02 ENCOUNTER — Other Ambulatory Visit: Payer: Self-pay | Admitting: Physician Assistant

## 2014-10-02 ENCOUNTER — Encounter (HOSPITAL_COMMUNITY): Payer: Self-pay | Admitting: Nephrology

## 2014-10-02 DIAGNOSIS — N186 End stage renal disease: Secondary | ICD-10-CM | POA: Diagnosis present

## 2014-10-02 DIAGNOSIS — I2510 Atherosclerotic heart disease of native coronary artery without angina pectoris: Secondary | ICD-10-CM | POA: Diagnosis present

## 2014-10-02 DIAGNOSIS — Z950 Presence of cardiac pacemaker: Secondary | ICD-10-CM | POA: Diagnosis not present

## 2014-10-02 DIAGNOSIS — Z951 Presence of aortocoronary bypass graft: Secondary | ICD-10-CM | POA: Diagnosis not present

## 2014-10-02 DIAGNOSIS — Z992 Dependence on renal dialysis: Secondary | ICD-10-CM | POA: Diagnosis not present

## 2014-10-02 DIAGNOSIS — Z87891 Personal history of nicotine dependence: Secondary | ICD-10-CM | POA: Diagnosis not present

## 2014-10-02 DIAGNOSIS — E785 Hyperlipidemia, unspecified: Secondary | ICD-10-CM | POA: Diagnosis present

## 2014-10-02 DIAGNOSIS — I96 Gangrene, not elsewhere classified: Secondary | ICD-10-CM | POA: Diagnosis not present

## 2014-10-02 DIAGNOSIS — Z79899 Other long term (current) drug therapy: Secondary | ICD-10-CM | POA: Diagnosis not present

## 2014-10-02 DIAGNOSIS — X58XXXS Exposure to other specified factors, sequela: Secondary | ICD-10-CM | POA: Diagnosis present

## 2014-10-02 DIAGNOSIS — I120 Hypertensive chronic kidney disease with stage 5 chronic kidney disease or end stage renal disease: Secondary | ICD-10-CM | POA: Diagnosis present

## 2014-10-02 DIAGNOSIS — I70268 Atherosclerosis of native arteries of extremities with gangrene, other extremity: Secondary | ICD-10-CM | POA: Diagnosis present

## 2014-10-02 DIAGNOSIS — I70261 Atherosclerosis of native arteries of extremities with gangrene, right leg: Secondary | ICD-10-CM | POA: Diagnosis not present

## 2014-10-02 DIAGNOSIS — Z7982 Long term (current) use of aspirin: Secondary | ICD-10-CM | POA: Diagnosis not present

## 2014-10-02 DIAGNOSIS — I10 Essential (primary) hypertension: Secondary | ICD-10-CM

## 2014-10-02 DIAGNOSIS — M109 Gout, unspecified: Secondary | ICD-10-CM | POA: Diagnosis present

## 2014-10-02 DIAGNOSIS — N2581 Secondary hyperparathyroidism of renal origin: Secondary | ICD-10-CM | POA: Diagnosis present

## 2014-10-02 DIAGNOSIS — I252 Old myocardial infarction: Secondary | ICD-10-CM | POA: Diagnosis not present

## 2014-10-02 DIAGNOSIS — M79673 Pain in unspecified foot: Secondary | ICD-10-CM | POA: Diagnosis present

## 2014-10-02 DIAGNOSIS — I509 Heart failure, unspecified: Secondary | ICD-10-CM | POA: Diagnosis present

## 2014-10-02 DIAGNOSIS — S99921S Unspecified injury of right foot, sequela: Secondary | ICD-10-CM | POA: Diagnosis not present

## 2014-10-02 DIAGNOSIS — M1A3790 Chronic gout due to renal impairment, unspecified ankle and foot, without tophus (tophi): Secondary | ICD-10-CM

## 2014-10-02 DIAGNOSIS — R252 Cramp and spasm: Secondary | ICD-10-CM

## 2014-10-02 DIAGNOSIS — I441 Atrioventricular block, second degree: Secondary | ICD-10-CM | POA: Diagnosis present

## 2014-10-02 LAB — COMPREHENSIVE METABOLIC PANEL
ALT: 13 U/L (ref 0–53)
ANION GAP: 17 — AB (ref 5–15)
AST: 34 U/L (ref 0–37)
Albumin: 2.9 g/dL — ABNORMAL LOW (ref 3.5–5.2)
Alkaline Phosphatase: 72 U/L (ref 39–117)
BILIRUBIN TOTAL: 0.8 mg/dL (ref 0.3–1.2)
BUN: 39 mg/dL — ABNORMAL HIGH (ref 6–23)
CALCIUM: 8.6 mg/dL (ref 8.4–10.5)
CO2: 23 mmol/L (ref 19–32)
Chloride: 93 mmol/L — ABNORMAL LOW (ref 96–112)
Creatinine, Ser: 11.4 mg/dL — ABNORMAL HIGH (ref 0.50–1.35)
GFR, EST AFRICAN AMERICAN: 4 mL/min — AB (ref >90)
GFR, EST NON AFRICAN AMERICAN: 4 mL/min — AB (ref >90)
GLUCOSE: 80 mg/dL (ref 70–99)
Potassium: 3.4 mmol/L — ABNORMAL LOW (ref 3.5–5.1)
Sodium: 133 mmol/L — ABNORMAL LOW (ref 135–145)
Total Protein: 6.7 g/dL (ref 6.0–8.3)

## 2014-10-02 LAB — CBC
HCT: 31.1 % — ABNORMAL LOW (ref 39.0–52.0)
Hemoglobin: 9.9 g/dL — ABNORMAL LOW (ref 13.0–17.0)
MCH: 27.9 pg (ref 26.0–34.0)
MCHC: 31.8 g/dL (ref 30.0–36.0)
MCV: 87.6 fL (ref 78.0–100.0)
Platelets: 171 10*3/uL (ref 150–400)
RBC: 3.55 MIL/uL — ABNORMAL LOW (ref 4.22–5.81)
RDW: 15.2 % (ref 11.5–15.5)
WBC: 5.8 10*3/uL (ref 4.0–10.5)

## 2014-10-02 LAB — C-REACTIVE PROTEIN: CRP: 7 mg/dL — AB (ref <0.60)

## 2014-10-02 LAB — SEDIMENTATION RATE: Sed Rate: 80 mm/hr — ABNORMAL HIGH (ref 0–16)

## 2014-10-02 LAB — SURGICAL PCR SCREEN
MRSA, PCR: NEGATIVE
STAPHYLOCOCCUS AUREUS: POSITIVE — AB

## 2014-10-02 LAB — PHOSPHORUS: Phosphorus: 5.1 mg/dL — ABNORMAL HIGH (ref 2.3–4.6)

## 2014-10-02 MED ORDER — VANCOMYCIN HCL IN DEXTROSE 1-5 GM/200ML-% IV SOLN: 1000.0000 mg | Freq: Once | INTRAVENOUS | Status: DC

## 2014-10-02 MED ORDER — CARVEDILOL 6.25 MG PO TABS
6.2500 mg | ORAL_TABLET | Freq: Two times a day (BID) | ORAL | Status: DC
  Administered 2014-10-02 – 2014-10-05 (×5): 6.25 mg via ORAL
  Filled 2014-10-02 (×7): qty 1

## 2014-10-02 MED ORDER — COLCRYS 0.6 MG PO TABS: 0.6000 mg | ORAL_TABLET | Freq: Every day | ORAL | Status: DC

## 2014-10-02 MED ORDER — DOXERCALCIFEROL 4 MCG/2ML IV SOLN
4.0000 ug | INTRAVENOUS | Status: DC
  Administered 2014-10-02 – 2014-10-04 (×2): 4 ug via INTRAVENOUS
  Filled 2014-10-02 (×2): qty 2

## 2014-10-02 MED ORDER — DARBEPOETIN ALFA 60 MCG/0.3ML IJ SOSY
PREFILLED_SYRINGE | INTRAMUSCULAR | Status: AC
  Administered 2014-10-02: 60 ug via INTRAVENOUS
  Filled 2014-10-02: qty 0.3

## 2014-10-02 MED ORDER — VANCOMYCIN HCL IN DEXTROSE 750-5 MG/150ML-% IV SOLN
750.0000 mg | INTRAVENOUS | Status: DC
  Administered 2014-10-02 – 2014-10-04 (×2): 750 mg via INTRAVENOUS
  Filled 2014-10-02 (×4): qty 150

## 2014-10-02 MED ORDER — VANCOMYCIN HCL 10 G IV SOLR
1500.0000 mg | Freq: Once | INTRAVENOUS | Status: AC
  Administered 2014-10-02: 1500 mg via INTRAVENOUS
  Filled 2014-10-02: qty 1500

## 2014-10-02 MED ORDER — CYCLOBENZAPRINE HCL 5 MG PO TABS: 5.0000 mg | ORAL_TABLET | Freq: Three times a day (TID) | ORAL | Status: AC | PRN

## 2014-10-02 MED ORDER — ATORVASTATIN CALCIUM 40 MG PO TABS
40.0000 mg | ORAL_TABLET | Freq: Every day | ORAL | Status: DC
  Administered 2014-10-02 – 2014-10-05 (×3): 40 mg via ORAL
  Filled 2014-10-02 (×3): qty 1

## 2014-10-02 MED ORDER — ONDANSETRON HCL 4 MG PO TABS: 4.0000 mg | ORAL_TABLET | Freq: Four times a day (QID) | ORAL | Status: DC | PRN

## 2014-10-02 MED ORDER — CYCLOBENZAPRINE HCL 10 MG PO TABS
5.0000 mg | ORAL_TABLET | Freq: Three times a day (TID) | ORAL | Status: DC | PRN
  Filled 2014-10-02: qty 1

## 2014-10-02 MED ORDER — COLCHICINE 0.6 MG PO TABS
0.6000 mg | ORAL_TABLET | Freq: Every day | ORAL | Status: DC
  Administered 2014-10-02: 0.6 mg via ORAL
  Filled 2014-10-02: qty 1

## 2014-10-02 MED ORDER — ACETAMINOPHEN 650 MG RE SUPP: 650.0000 mg | Freq: Four times a day (QID) | RECTAL | Status: DC | PRN

## 2014-10-02 MED ORDER — HEPARIN SODIUM (PORCINE) 5000 UNIT/ML IJ SOLN
5000.0000 [IU] | Freq: Three times a day (TID) | INTRAMUSCULAR | Status: DC
  Administered 2014-10-02 – 2014-10-05 (×6): 5000 [IU] via SUBCUTANEOUS
  Filled 2014-10-02 (×10): qty 1

## 2014-10-02 MED ORDER — DARBEPOETIN ALFA 60 MCG/0.3ML IJ SOSY
60.0000 ug | PREFILLED_SYRINGE | INTRAMUSCULAR | Status: DC
  Administered 2014-10-02: 60 ug via INTRAVENOUS
  Filled 2014-10-02: qty 0.3

## 2014-10-02 MED ORDER — ACETAMINOPHEN 325 MG PO TABS
650.0000 mg | ORAL_TABLET | Freq: Four times a day (QID) | ORAL | Status: DC | PRN
  Administered 2014-10-04: 650 mg via ORAL
  Filled 2014-10-02: qty 2

## 2014-10-02 MED ORDER — MUPIROCIN 2 % EX OINT
1.0000 "application " | TOPICAL_OINTMENT | Freq: Two times a day (BID) | CUTANEOUS | Status: DC
  Administered 2014-10-02 – 2014-10-05 (×5): 1 via NASAL
  Filled 2014-10-02 (×2): qty 22

## 2014-10-02 MED ORDER — ONDANSETRON HCL 4 MG/2ML IJ SOLN: 4.0000 mg | Freq: Four times a day (QID) | INTRAMUSCULAR | Status: DC | PRN

## 2014-10-02 MED ORDER — ISOSORBIDE MONONITRATE ER 30 MG PO TB24
30.0000 mg | ORAL_TABLET | Freq: Every day | ORAL | Status: DC
  Administered 2014-10-02 – 2014-10-03 (×2): 30 mg via ORAL
  Filled 2014-10-02 (×2): qty 1

## 2014-10-02 MED ORDER — ASPIRIN EC 81 MG PO TBEC
81.0000 mg | DELAYED_RELEASE_TABLET | Freq: Every day | ORAL | Status: DC
  Administered 2014-10-02 – 2014-10-05 (×3): 81 mg via ORAL
  Filled 2014-10-02 (×3): qty 1

## 2014-10-02 MED ORDER — PANTOPRAZOLE SODIUM 40 MG PO TBEC
40.0000 mg | DELAYED_RELEASE_TABLET | Freq: Every day | ORAL | Status: DC
  Administered 2014-10-02 – 2014-10-05 (×3): 40 mg via ORAL
  Filled 2014-10-02 (×3): qty 1

## 2014-10-02 MED ORDER — DOXERCALCIFEROL 4 MCG/2ML IV SOLN
INTRAVENOUS | Status: AC
  Administered 2014-10-02: 4 ug via INTRAVENOUS
  Filled 2014-10-02: qty 2

## 2014-10-02 MED ORDER — CHLORHEXIDINE GLUCONATE CLOTH 2 % EX PADS
6.0000 | MEDICATED_PAD | Freq: Every day | CUTANEOUS | Status: DC
  Administered 2014-10-02 – 2014-10-05 (×3): 6 via TOPICAL

## 2014-10-02 NOTE — Consult Note (Signed)
Rebecka ApleyHenry S Stencil 10/02/2014 Arita MissSANFORD, Miyo Aina B Requesting Physician:  Leodis BinetP Patel MD  Reason for Consult:  Comanagement of ESRD HPI:  81 year old male with ESRD on HD at the feet, even admitted overnight with a necrotic third toe. Patient has been on hemodialysis from us 15 years. He continues to live independently and has close connections with his daughter. They have no concerns with recent problems about dialysis. He uses a right forearm AV graft.  He had trauma to the right toe several weeks back. It has become progressively blackened. He was seen by podiatry and eating and started on antibiotics but has not received except once because he missed his treatment on Saturday as he was unable to successfully transferred to the transportation bus. He has not had dialysis since Thursday. He has not been eating very well.   Filed Weights   10/01/14 1605 10/02/14 0159  Weight: 70.308 kg (155 lb) 70.7 kg (155 lb 13.8 oz)       ROS Balance of 12 systems is negative w/ exceptions as above  Outpt HD Orders Unit: [facility] Days: THS Time: 3h Dialyzer: Revaclear EDW: 96.5kg K/Ca: 2/2.5 Access: AVF L FA Needle Size: 15g BFR/DFR: 400/600 UF Proflie: none VDRA: Hectorol 4mcg qTx EPO: Epogen 4000 units IVP qTx IV Fe: none Heparin: 2000 units IVB qTx Most Recent Phos / PTH: ?  PMH  Past Medical History  Diagnosis Date  . Hyperlipidemia   . Hypertension   . Congestive heart failure, unspecified   . Coronary atherosclerosis of native coronary artery   . 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 End stage renal disease     on HD MWF; [facility]  . Aortoiliac occlusive disease   . Secondary hyperparathyroidism   . Gout   . MI (myocardial infarction)   . Hemodialysis patient 1999  . Shortness of breath   . Pneumonia 08/09/2012   PSH  Past Surgical History  Procedure Laterality Date  . Coronary artery bypass graft  04/18/05    Rachell CiproEdward Gerhardt,MD  . Ppm  11/18/05     Medtronic EnRhythm P1501-complete heart block  . Dialysis fistula creation    . Pacemaker generator change  08/05/12    MDT Adaptal L generator placd by Dr Johney FrameAllred  . Permanent pacemaker generator change N/A 08/05/2012    Procedure: PERMANENT PACEMAKER GENERATOR CHANGE;  Surgeon: Hillis RangeJames Allred, MD;  [facility];  Service: Cardiovascular;  Laterality: N/A;   FH History reviewed. No pertinent family history. SH  reports that he quit smoking about 19 years ago. His smoking use included Cigarettes. He has a 50 pack-year smoking history. He has never used smokeless tobacco. He reports that he does not drink alcohol or use illicit drugs. Allergies No Known Allergies Home medications Prior to Admission medications   Medication Sig Start Date End Date Taking? Authorizing Provider  aspirin 81 MG tablet Take 81 mg by mouth daily.   Yes Historical Provider, MD  atorvastatin (LIPITOR) 40 MG tablet Take 40 mg by mouth daily.   Yes Historical Provider, MD  carvedilol (COREG) 6.25 MG tablet TAKE ONE TABLET BY MOUTH TWICE DAILY WITH FOOD 06/12/14  Yes Ernestina Pennaonald W Moore, MD  COLCRYS 0.6 MG tablet Take 1 tablet (0.6 mg total) by mouth daily. 09/14/14  Yes Tiffany A Gann, PA-C  cyclobenzaprine (FLEXERIL) 5 MG tablet Take 1 tablet (5 mg total) by mouth 3 (three) times daily as needed for muscle spasms.  07/16/14  Yes Deatra Canter, FNP  isosorbide mononitrate (IMDUR) 30 MG 24 hr tablet TAKE ONE TABLET BY MOUTH ONE TIME DAILY 07/06/14  Yes Mary-Margaret Daphine Deutscher, FNP  multivitamin (RENA-VIT) TABS tablet Take 1 tablet by mouth daily.   Yes Historical Provider, MD  mupirocin ointment (BACTROBAN) 2 % Apply topical to wound on toe TID. Cover with nonstick gauze Patient taking differently: Apply 1 application topically See admin instructions. Apply topical to wound on toe TID. Cover with nonstick gauze 09/28/14  Yes Tiffany A Gann, PA-C  pantoprazole (PROTONIX) 40 MG tablet TAKE ONE TABLET BY MOUTH ONE TIME DAILY 06/12/14   Yes Ernestina Penna, MD  sevelamer (RENAGEL) 800 MG tablet Take 800 mg by mouth See admin instructions. Take 5 tabs once a day   Yes Historical Provider, MD    Current Medications Scheduled Meds: . aspirin EC  81 mg Oral Daily  . atorvastatin  40 mg Oral Daily  . carvedilol  6.25 mg Oral BID WC  . colchicine  0.6 mg Oral Daily  . heparin  5,000 Units Subcutaneous 3 times per day  . isosorbide mononitrate  30 mg Oral Daily  . pantoprazole  40 mg Oral Daily  . vancomycin  750 mg Intravenous Q T,Th,Sa-HD   Continuous Infusions:  PRN Meds:.acetaminophen **OR** acetaminophen, cyclobenzaprine, ondansetron **OR** ondansetron (ZOFRAN) IV  CBC  Recent Labs Lab 10/01/14 1620 10/01/14 1631 10/02/14 0251  WBC 5.4  --  5.8  NEUTROABS 3.3  --   --   HGB 9.8* 10.5* 9.9*  HCT 30.2* 31.0* 31.1*  MCV 88.8  --  87.6  PLT 154  --  171   Basic Metabolic Panel  Recent Labs Lab 10/01/14 1631 10/02/14 0251  NA 134* 133*  K 3.4* 3.4*  CL 97 93*  CO2  --  23  GLUCOSE 85 80  BUN 40* 39*  CREATININE 11.00* 11.40*  CALCIUM  --  8.6    Physical Exam  Blood pressure 124/67, pulse 94, temperature 97.5 F (36.4 C), temperature source Oral, resp. rate 16, height 6' (1.829 m), weight 70.7 kg (155 lb 13.8 oz), SpO2 100 %. GEN: NAD, think, frail ENT: NCAT, poor dentition EYES: EOMI CV: RRR PULM: CTAB ABD: s/nt/nd SKIN: R 3rd toe with distal necrosis/blackening EXT:no LEE   A/P 1. ESRD:  1. THS [facility] via R FA AVG 15g 2. HD today 3. 4K Bath 2/2 low K 2. HTN/Vol:  1. Stable, cont home meds 2. EDW 69.5kg right now, no hypervolemia present 3. Anemia:  1. Cont ESA, use aranesp 60 qWk 4. MBD: cont Hectorol4qTx, no binder or sensipar as outpt, check Phos 5. Necrotic R 3rd toe + PAD: VVS to see today  Sabra Heck MD 10/02/2014, 11:01 AM

## 2014-10-02 NOTE — Telephone Encounter (Signed)
Prescription sent to pharmacy.

## 2014-10-02 NOTE — Progress Notes (Signed)
2nd RN skin assessment 

## 2014-10-02 NOTE — ED Provider Notes (Signed)
CSN: [id number]     Arrival date & time 10/01/14  1546 History   First MD Initiated Contact with Patient 10/01/14 2214     Chief Complaint  Patient presents with  . Foot Pain     (Consider location/radiation/quality/duration/timing/severity/associated sxs/prior Treatment) HPI Comments: Pt comes in with cc of R foot pain. Pt has hx of ESRD, CAD, possibly vascular occlusive disease. He reports that 2 weeks ago he bumped his foot into something, and he has been having his foot doctor and wound care f/u on the wound. Pt is getting antibiotics with his HD. Reports that the area, specifically the 3rd toe has gotten worse, and so he decided to come to the ER. He also has increased leg swelling on the ipsilateral side. Pt has ? Drainage from the 3rd toe and the great toe. No n/v/f/c. Last HD was on Thursday.   ROS 10 Systems reviewed and are negative for acute change except as noted in the HPI.     Patient is a 81 y.o. male presenting with lower extremity pain. The history is provided by the patient and a relative.  Foot Pain    Past Medical History  Diagnosis Date  . Hyperlipidemia   . Hypertension   . Congestive heart failure, unspecified   . Coronary atherosclerosis of native coronary artery   . 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 End stage renal disease     on HD MWF  . Aortoiliac occlusive disease   . Secondary hyperparathyroidism   . Gout   . MI (myocardial infarction)   . Hemodialysis patient 1999  . Shortness of breath   . Pneumonia 08/09/2012   Past Surgical History  Procedure Laterality Date  . Coronary artery bypass graft  04/18/05    Rachell Cipro  . Ppm  11/18/05    Medtronic EnRhythm P1501-complete heart block  . Dialysis fistula creation    . Pacemaker generator change  08/05/12    MDT Adaptal L generator placd by Dr Johney Frame  . Permanent pacemaker generator change N/A 08/05/2012    Procedure: PERMANENT PACEMAKER GENERATOR CHANGE;   Surgeon: Hillis Range, MD;  Location: [facility] CATH LAB;  Service: Cardiovascular;  Laterality: N/A;   History reviewed. No pertinent family history. History  Substance Use Topics  . Smoking status: Former Smoker -- 1.00 packs/day for 50 years    Types: Cigarettes    Quit date: 06/09/1995  . Smokeless tobacco: Never Used  . Alcohol Use: No    Review of Systems  Skin: Positive for color change, rash and wound.  All other systems reviewed and are negative.     Allergies  Review of patient's allergies indicates no known allergies.  Home Medications   Prior to Admission medications   Medication Sig Start Date End Date Taking? Authorizing Provider  aspirin 81 MG tablet Take 81 mg by mouth daily.   Yes Historical Provider, MD  atorvastatin (LIPITOR) 40 MG tablet Take 40 mg by mouth daily.   Yes Historical Provider, MD  carvedilol (COREG) 6.25 MG tablet TAKE ONE TABLET BY MOUTH TWICE DAILY WITH FOOD 06/12/14  Yes Ernestina Penna, MD  COLCRYS 0.6 MG tablet Take 1 tablet (0.6 mg total) by mouth daily. 09/14/14  Yes Tiffany A Gann, PA-C  cyclobenzaprine (FLEXERIL) 5 MG tablet Take 1 tablet (5 mg total) by mouth 3 (three) times daily as needed for muscle spasms. 07/16/14  Yes Deatra Canter, FNP  isosorbide  mononitrate (IMDUR) 30 MG 24 hr tablet TAKE ONE TABLET BY MOUTH ONE TIME DAILY 07/06/14  Yes Mary-Margaret Daphine Deutscher, FNP  multivitamin (RENA-VIT) TABS tablet Take 1 tablet by mouth daily.   Yes Historical Provider, MD  mupirocin ointment (BACTROBAN) 2 % Apply topical to wound on toe TID. Cover with nonstick gauze Patient taking differently: Apply 1 application topically See admin instructions. Apply topical to wound on toe TID. Cover with nonstick gauze 09/28/14  Yes Tiffany A Gann, PA-C  pantoprazole (PROTONIX) 40 MG tablet TAKE ONE TABLET BY MOUTH ONE TIME DAILY 06/12/14  Yes Ernestina Penna, MD  sevelamer (RENAGEL) 800 MG tablet Take 800 mg by mouth See admin instructions. Take 5 tabs once a day   Yes  Historical Provider, MD   BP 140/80 mmHg  Pulse 92  Temp(Src) 97.6 F (36.4 C) (Oral)  Resp 16  Ht 6' (1.829 m)  Wt 155 lb (70.308 kg)  BMI 21.02 kg/m2  SpO2 100% Physical Exam  Constitutional: He is oriented to person, place, and time. He appears well-developed.  HENT:  Head: Normocephalic and atraumatic.  Eyes: Conjunctivae and EOM are normal. Pupils are equal, round, and reactive to light.  Neck: Normal range of motion. Neck supple.  Cardiovascular: Normal rate, regular rhythm, normal heart sounds and intact distal pulses.   Pulmonary/Chest: Effort normal and breath sounds normal. No respiratory distress. He has no wheezes.  Abdominal: Soft. Bowel sounds are normal. He exhibits no distension. There is no tenderness. There is no rebound and no guarding.  Musculoskeletal: He exhibits edema.  Great toe - a small pressure ulcer like lesion. Mild swelling. Mild tenderness to palpation. 3rd toe - hyperpigmented and gangrenous, with no sensation  Neurological: He is alert and oriented to person, place, and time.  Skin: Skin is warm.  Nursing note and vitals reviewed.   ED Course  Procedures (including critical care time) Labs Review Labs Reviewed  CBC WITH DIFFERENTIAL/PLATELET - Abnormal; Notable for the following:    RBC 3.40 (*)    Hemoglobin 9.8 (*)    HCT 30.2 (*)    Monocytes Relative 16 (*)    All other components within normal limits  SEDIMENTATION RATE - Abnormal; Notable for the following:    Sed Rate 80 (*)    All other components within normal limits  I-STAT CHEM 8, ED - Abnormal; Notable for the following:    Sodium 134 (*)    Potassium 3.4 (*)    BUN 40 (*)    Creatinine, Ser 11.00 (*)    Calcium, Ion 0.93 (*)    Hemoglobin 10.5 (*)    HCT 31.0 (*)    All other components within normal limits  C-REACTIVE PROTEIN    Imaging Review Dg Foot Complete Right  10/01/2014   CLINICAL DATA:  Irritation at the right first and third toes. Initial encounter.  EXAM:  RIGHT FOOT COMPLETE - 3+ VIEW  COMPARISON:  Right foot radiographs performed 09/04/2014  FINDINGS: There is no evidence of fracture or dislocation. The joint spaces are preserved. There is no evidence of talar subluxation; the subtalar joint is unremarkable in appearance.  Dorsal soft tissue swelling is noted at the forefoot. Diffuse vascular calcifications are seen.  IMPRESSION: No evidence of fracture or dislocation.   Electronically Signed   By: Roanna Raider M.D.   On: 10/01/2014 23:34     EKG Interpretation None      MDM   Final diagnoses:  Gangrene of toe  ESRD (  end stage renal disease) on dialysis    Pt comes in with dry gangrene of the 3rd toe, R foot. DDx also includes surrounding cellulitis and osteomyelitis. Will admit for incision and drainage of the wound. Medicine to consult pharmacy for antibiotics. Pt will need wound care consult and possible help from surgical staff of debridment. Pt has a palpable DP.   Derwood KaplanAnkit Wilhelm Ganaway, MD 10/02/14 0045

## 2014-10-02 NOTE — Progress Notes (Signed)
Utilization review completed.  

## 2014-10-02 NOTE — Progress Notes (Signed)
I have seen and assessed patient and agree with Dr. Eliane DecreePatel's assessment and plan. Patient had presented with a necrotic toe. Vascular surgery was consulted and per admitting physician will be seeing the patient. Patient has been seen by nephrology and patient for hemodialysis today. Will place patient on a renal diet for now until he is assessed by vascular surgery. Follow.

## 2014-10-02 NOTE — Progress Notes (Signed)
Hemodialysis=Tolerated well. 1L removed. Pt is at EDW. No complaints. Report called to primary RN

## 2014-10-02 NOTE — Progress Notes (Signed)
ANTIBIOTIC CONSULT NOTE - INITIAL  Pharmacy Consult for vancomycin Indication: cellulitis  No Known Allergies  Patient Measurements: Height: 6' (182.9 cm) Weight: 155 lb 13.8 oz (70.7 kg) IBW/kg (Calculated) : 77.6  Vital Signs: Temp: 97.6 F (36.4 C) (04/25 2245) Temp Source: Oral (04/25 2245) BP: 137/80 mmHg (04/26 0159) Pulse Rate: 92 (04/26 0159)  Labs:  Recent Labs  10/01/14 1620 10/01/14 1631  WBC 5.4  --   HGB 9.8* 10.5*  PLT 154  --   CREATININE  --  11.00*   Estimated Creatinine Clearance: 4.9 mL/min (by C-G formula based on Cr of 11).   Microbiology: Recent Results (from the past 720 hour(s))  Aerobic culture     Status: None   Collection Time: 09/28/14  5:06 PM  Result Value Ref Range Status   Aerobic Bacterial Culture Final report  Final   Result 1 Comment  Final    Comment: No growth in 36 - 48 hours.    Medical History: Past Medical History  Diagnosis Date  . Hyperlipidemia   . Hypertension   . Congestive heart failure, unspecified   . Coronary atherosclerosis of native coronary artery   . 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 End stage renal disease     on HD MWF  . Aortoiliac occlusive disease   . Secondary hyperparathyroidism   . Gout   . MI (myocardial infarction)   . Hemodialysis patient 1999  . Shortness of breath   . Pneumonia 08/09/2012    Medications:  Prescriptions prior to admission  Medication Sig Dispense Refill Last Dose  . aspirin 81 MG tablet Take 81 mg by mouth daily.   10/01/2014 at Unknown time  . atorvastatin (LIPITOR) 40 MG tablet Take 40 mg by mouth daily.   10/01/2014 at Unknown time  . carvedilol (COREG) 6.25 MG tablet TAKE ONE TABLET BY MOUTH TWICE DAILY WITH FOOD 60 tablet 4 10/01/2014 at Unknown time  . COLCRYS 0.6 MG tablet Take 1 tablet (0.6 mg total) by mouth daily. 30 tablet 0 10/01/2014 at Unknown time  . cyclobenzaprine (FLEXERIL) 5 MG tablet Take 1 tablet (5 mg total) by mouth 3  (three) times daily as needed for muscle spasms. 30 tablet 1 Past Week at Unknown time  . isosorbide mononitrate (IMDUR) 30 MG 24 hr tablet TAKE ONE TABLET BY MOUTH ONE TIME DAILY 30 tablet 3 10/01/2014 at Unknown time  . multivitamin (RENA-VIT) TABS tablet Take 1 tablet by mouth daily.   10/01/2014 at Unknown time  . mupirocin ointment (BACTROBAN) 2 % Apply topical to wound on toe TID. Cover with nonstick gauze (Patient taking differently: Apply 1 application topically See admin instructions. Apply topical to wound on toe TID. Cover with nonstick gauze) 22 g 0 Past Week at Unknown time  . pantoprazole (PROTONIX) 40 MG tablet TAKE ONE TABLET BY MOUTH ONE TIME DAILY 30 tablet 4 10/01/2014 at Unknown time  . sevelamer (RENAGEL) 800 MG tablet Take 800 mg by mouth See admin instructions. Take 5 tabs once a day   10/01/2014 at Unknown time   Scheduled:  . aspirin  81 mg Oral Daily  . atorvastatin  40 mg Oral Daily  . carvedilol  6.25 mg Oral BID WC  . colchicine  0.6 mg Oral Daily  . heparin  5,000 Units Subcutaneous 3 times per day  . isosorbide mononitrate  30 mg Oral Daily  . pantoprazole  40 mg Oral Daily  .  vancomycin  1,500 mg Intravenous Once  . vancomycin  750 mg Intravenous Q T,Th,Sa-HD    Assessment: 81yo male c/o injury to right foot 2-3d ago, now states toe is infected w/ drainage, to begin IV ABX for cellulitis; of note pt missed Sat HD.  Goal of Therapy:  Pre-HD vanc level 15-25  Plan:  Will give vancomycin  IV x1 followed by  IV after each HD and monitor CBC, Cx, levels prn.  Vernard Gambles, PharmD, BCPS  10/02/2014,2:03 AM

## 2014-10-02 NOTE — H&P (Signed)
[facility] History and Physical  Patient: Jonathan Padilla  MRN: [id number]  DOB: 11-24-1927  DOS: the patient was seen and examined on 10/02/2014 PCP: Chevis Pretty, FNP  Referring physician: Varney Biles, MD Chief Complaint: Right foot pain  HPI: Jonathan Piccolo Chaisson is a 81 y.o. male with Past medical history of ESRD on hemodialysis, coronary artery disease, essential hypertension, peripheral vascular disease. The patient is presenting with complaints of right third toe pain and blackening. History was obtained from patient as well as family member. As per the patient he had injury to his right third toe 3 weeks ago. 2 weeks ago the family noted some discoloration and the patient has been following up with primary care doctor as well as podiatrist. Patient was sent to [facility] for lower extremity vascular Doppler which was showing moderate disease bilaterally. After this the podiatrist started the patient on IV antibiotics with his hemodialysis for 5 days. The patient has received one dose of antibiotic. Since then the patient has been having progressively worsening lethargy and fatigue. He has missed his hemodialysis on Saturday. Over the weekend the patient's condition further worsen with weakness and fatigue and therefore patient was brought in to ER tonight. Patient at the time of my evaluation denies any dizziness lightheadedness chest pain shot as of breath cough fever chills nausea vomiting diarrhea constipation or focal deficit. Patient follows up with vascular surgery for his AV fistula at [facility].  The patient is coming from home. And at his baseline independent for most of his ADL.  Review of Systems: as mentioned in the history of present illness.  A comprehensive review of the other systems is negative.  Past Medical History  Diagnosis Date  . Hyperlipidemia   . Hypertension   . Congestive heart failure, unspecified   . Coronary  atherosclerosis of native coronary artery   . 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 End stage renal disease     on HD MWF  . Aortoiliac occlusive disease   . Secondary hyperparathyroidism   . Gout   . MI (myocardial infarction)   . Hemodialysis patient 1999  . Shortness of breath   . Pneumonia 08/09/2012   Past Surgical History  Procedure Laterality Date  . Coronary artery bypass graft  04/18/05    Madelyn Flavors  . Ppm  11/18/05    Medtronic EnRhythm P1501-complete heart block  . Dialysis fistula creation    . Pacemaker generator change  08/05/12    MDT Adaptal L generator placd by Dr Rayann Heman  . Permanent pacemaker generator change N/A 08/05/2012    Procedure: PERMANENT PACEMAKER GENERATOR CHANGE;  Surgeon: Thompson Grayer, MD;  Location: [facility] CATH LAB;  Service: Cardiovascular;  Laterality: N/A;   Social History:  reports that he quit smoking about 19 years ago. His smoking use included Cigarettes. He has a 50 pack-year smoking history. He has never used smokeless tobacco. He reports that he does not drink alcohol or use illicit drugs.  No Known Allergies  History reviewed. No pertinent family history.  Prior to Admission medications   Medication Sig Start Date End Date Taking? Authorizing Provider  aspirin 81 MG tablet Take 81 mg by mouth daily.   Yes Historical Provider, MD  atorvastatin (LIPITOR) 40 MG tablet Take 40 mg by mouth daily.   Yes Historical Provider, MD  carvedilol (COREG) 6.25 MG tablet TAKE ONE TABLET BY MOUTH TWICE DAILY WITH FOOD 06/12/14  Yes Chipper Herb, MD  COLCRYS 0.6 MG tablet Take 1 tablet (0.6 mg total) by mouth daily. 09/14/14  Yes Tiffany A Gann, PA-C  cyclobenzaprine (FLEXERIL) 5 MG tablet Take 1 tablet (5 mg total) by mouth 3 (three) times daily as needed for muscle spasms. 07/16/14  Yes Lysbeth Penner, FNP  isosorbide mononitrate (IMDUR) 30 MG 24 hr tablet TAKE ONE TABLET BY MOUTH ONE TIME DAILY 07/06/14  Yes Mary-Margaret  Hassell Done, FNP  multivitamin (RENA-VIT) TABS tablet Take 1 tablet by mouth daily.   Yes Historical Provider, MD  mupirocin ointment (BACTROBAN) 2 % Apply topical to wound on toe TID. Cover with nonstick gauze Patient taking differently: Apply 1 application topically See admin instructions. Apply topical to wound on toe TID. Cover with nonstick gauze 09/28/14  Yes Tiffany A Gann, PA-C  pantoprazole (PROTONIX) 40 MG tablet TAKE ONE TABLET BY MOUTH ONE TIME DAILY 06/12/14  Yes Chipper Herb, MD  sevelamer (RENAGEL) 800 MG tablet Take 800 mg by mouth See admin instructions. Take 5 tabs once a day   Yes Historical Provider, MD    Physical Exam: Filed Vitals:   10/01/14 2245 10/01/14 2330 10/02/14 0000 10/02/14 0030  BP: 138/78 132/82 144/82 140/80  Pulse: 87 90 88 92  Temp: 97.6 F (36.4 C)     TempSrc: Oral     Resp: _0 Height:      Weight:      SpO2: 100% 100% 100% 100%    General: Alert, Awake and Oriented to Time, Place and Person. Appear in mild distress Eyes: PERRL ENT: Oral Mucosa clear moist. Neck: no JVD Cardiovascular: S1 and S2 Present, aortic systolic Murmur, Peripheral Pulses Present Respiratory: Bilateral Air entry equal and Decreased,  Clear to Auscultation, no Crackles, no wheezes Abdomen: Bowel Sound present, Soft and non tender Skin: no Rash Extremities: Bilateral right more than left Pedal edema, no calf tenderness Right third toe blackening discoloration no warmth no redness Neurologic: Grossly no focal neuro deficit.  Labs on Admission:  CBC:  Recent Labs Lab 10/01/14 1620 10/01/14 1631  WBC 5.4  --   NEUTROABS 3.3  --   HGB 9.8* 10.5*  HCT 30.2* 31.0*  MCV 88.8  --   PLT 154  --     CMP     Component Value Date/Time   NA 134* 10/01/2014 1631   K 3.4* 10/01/2014 1631   CL 97 10/01/2014 1631   CO2 26 08/13/2012 0649   GLUCOSE 85 10/01/2014 1631   BUN 40* 10/01/2014 1631   CREATININE 11.00* 10/01/2014 1631   CALCIUM 9.7 08/13/2012 0649    ALBUMIN 2.4* 08/13/2012 0649   GFRNONAA 7* 08/13/2012 0649   GFRAA 8* 08/13/2012 0649    No results for input(s): LIPASE, AMYLASE in the last 168 hours.  No results for input(s): CKTOTAL, CKMB, CKMBINDEX, TROPONINI in the last 168 hours. BNP (last 3 results) No results for input(s): BNP in the last 8760 hours.  ProBNP (last 3 results) No results for input(s): PROBNP in the last 8760 hours.   Radiological Exams on Admission: Dg Foot Complete Right  10/01/2014   CLINICAL DATA:  Irritation at the right first and third toes. Initial encounter.  EXAM: RIGHT FOOT COMPLETE - 3+ VIEW  COMPARISON:  Right foot radiographs performed 09/04/2014  FINDINGS: There is no evidence of fracture or dislocation. The joint spaces are preserved. There is no evidence of talar subluxation; the subtalar joint is unremarkable in appearance.  Dorsal soft tissue swelling is noted at the forefoot. Diffuse vascular calcifications are seen.  IMPRESSION: No evidence of fracture or dislocation.   Electronically Signed   By: Garald Balding M.D.   On: 10/01/2014 23:34   Assessment/Plan Principal Problem:   Gangrene of toe Active Problems:   Essential hypertension   ESRD (end stage renal disease) on dialysis   CAD (coronary artery disease)   Gout   Dyslipidemia   1. Gangrene of toe The patient is presenting with complaints of blackish discoloration of the right third toe. Patient has been given one dose of IV antibiotic as an outpatient. Patient has been having progressively worsening fatigue since then. Currently the patient will be admitted in the hospital. We will follow blood culture ESR CRP. Will start the patient on vancomycin. We'll discuss with vascular surgery in the morning about further workup.  2. ESRD on hemodialysis. Nephrology consult line has been called for continuation of his hemodialysis.  3. Essential hypertension. Continuing home medications.  4. History of gout. Continuing home  medications.  5. History of dyslipidemia. Continuing statin.  Advance goals of care discussion: Full code as per my discussion with patient   DVT Prophylaxis: subcutaneous Heparin Nutrition: Nothing by mouth except medications  Family Communication: family was present at bedside, opportunity was given to ask question and all questions were answered satisfactorily at the time of interview. Disposition: Admitted as inpatient, telemetry unit.  Author: Berle Mull, MD [facility] Pager: [phone] 10/02/2014  If 7PM-7AM, please contact night-coverage [email or web address] Password [email or web address]

## 2014-10-02 NOTE — ED Notes (Signed)
Dr. Patel at bedside 

## 2014-10-03 DIAGNOSIS — I70261 Atherosclerosis of native arteries of extremities with gangrene, right leg: Secondary | ICD-10-CM

## 2014-10-03 LAB — CBC
HCT: 32.2 % — ABNORMAL LOW (ref 39.0–52.0)
Hemoglobin: 10.2 g/dL — ABNORMAL LOW (ref 13.0–17.0)
MCH: 28 pg (ref 26.0–34.0)
MCHC: 31.7 g/dL (ref 30.0–36.0)
MCV: 88.5 fL (ref 78.0–100.0)
PLATELETS: 180 10*3/uL (ref 150–400)
RBC: 3.64 MIL/uL — ABNORMAL LOW (ref 4.22–5.81)
RDW: 15.5 % (ref 11.5–15.5)
WBC: 5.2 10*3/uL (ref 4.0–10.5)

## 2014-10-03 LAB — RENAL FUNCTION PANEL
ANION GAP: 13 (ref 5–15)
Albumin: 2.9 g/dL — ABNORMAL LOW (ref 3.5–5.2)
BUN: 18 mg/dL (ref 6–23)
CO2: 25 mmol/L (ref 19–32)
Calcium: 8.5 mg/dL (ref 8.4–10.5)
Chloride: 97 mmol/L (ref 96–112)
Creatinine, Ser: 6.87 mg/dL — ABNORMAL HIGH (ref 0.50–1.35)
GFR calc Af Amer: 7 mL/min — ABNORMAL LOW (ref >90)
GFR calc non Af Amer: 6 mL/min — ABNORMAL LOW (ref >90)
Glucose, Bld: 90 mg/dL (ref 70–99)
PHOSPHORUS: 3.3 mg/dL (ref 2.3–4.6)
Potassium: 3.6 mmol/L (ref 3.5–5.1)
Sodium: 135 mmol/L (ref 135–145)

## 2014-10-03 LAB — HEPATITIS B SURFACE ANTIGEN: Hepatitis B Surface Ag: NEGATIVE

## 2014-10-03 LAB — HIV ANTIBODY (ROUTINE TESTING W REFLEX): HIV SCREEN 4TH GENERATION: NONREACTIVE

## 2014-10-03 MED ORDER — LORAZEPAM 2 MG/ML IJ SOLN
0.5000 mg | Freq: Once | INTRAMUSCULAR | Status: AC
  Administered 2014-10-03: 0.5 mg via INTRAVENOUS
  Filled 2014-10-03: qty 1

## 2014-10-03 NOTE — Progress Notes (Signed)
Pt quiet since son has been with him most of the still incont of sttool at times

## 2014-10-03 NOTE — Progress Notes (Signed)
Admit: 10/01/2014 LOS: 1  87M ESRD on HD with necrotic 3rd toe and PAD  Subjective:  HD yesterday, 1L UF, post weight 69.2kg Delirium overnight Son in room, updated  04/26 0701 - 04/27 0700 In: 0  Out: 1000   Filed Weights   10/02/14 2000 10/02/14 2257 10/03/14 0500  Weight: 70.2 kg (154 lb 12.2 oz) 69.2 kg (152 lb 8.9 oz) 68.8 kg (151 lb 10.8 oz)    Scheduled Meds: . aspirin EC  81 mg Oral Daily  . atorvastatin  40 mg Oral Daily  . carvedilol  6.25 mg Oral BID WC  . Chlorhexidine Gluconate Cloth  6 each Topical Daily  . darbepoetin (ARANESP) injection - DIALYSIS  60 mcg Intravenous Q Tue-HD  . doxercalciferol  4 mcg Intravenous Q T,Th,Sa-HD  . heparin  5,000 Units Subcutaneous 3 times per day  . isosorbide mononitrate  30 mg Oral Daily  . mupirocin ointment  1 application Nasal BID  . pantoprazole  40 mg Oral Daily  . vancomycin  750 mg Intravenous Q T,Th,Sa-HD   Continuous Infusions:  PRN Meds:.acetaminophen **OR** acetaminophen, cyclobenzaprine, ondansetron **OR** ondansetron (ZOFRAN) IV  Current Labs: reviewed    Physical Exam:  Blood pressure 149/75, pulse 106, temperature 97.7 F (36.5 C), temperature source Oral, resp. rate 17, height 6' (1.829 m), weight 68.8 kg (151 lb 10.8 oz), SpO2 100 %. GEN: NAD, think, frail ENT: NCAT, poor dentition EYES: EOMI CV: RRR PULM: CTAB ABD: s/nt/nd SKIN: R 3rd toe with distal necrosis/blackening EXT:no LEE  Outpt HD Orders Unit: [facility] Days: THS Time: 3h Dialyzer: Revaclear EDW: 96.5kg K/Ca: 2/2.5 Access: AVF L FA Needle Size: 15g BFR/DFR: 400/600 UF Proflie: none VDRA: Hectorol 4mcg qTx EPO: Epogen 4000 units IVP qTx IV Fe: none Heparin: 2000 units IVB qTx Most Recent Phos / PTH: ?  A/P 1. ESRD:  1. THS [facility] via R FA AVG 15g 2. HD on schedule 3. 4K Bath 2/2 low K 2. HTN/Vol:  1. Stable, cont home meds 2. EDW 69.5kg right now, no hypervolemia present 3. Anemia:  1. Cont ESA, use aranesp  60 qWk 4. MBD: cont Hectorol4qTx, no binder or sensipar as outpt, check Phos 5. Necrotic R 3rd toe + PAD: VVS to see  Jonathan Heckyan Jonathan Meegan MD 10/03/2014, 10:46 AM   Recent Labs Lab 10/01/14 1631 10/02/14 0251 10/03/14 0549  NA 134* 133* 135  K 3.4* 3.4* 3.6  CL 97 93* 97  CO2  --  23 25  GLUCOSE 85 80 90  BUN 40* 39* 18  CREATININE 11.00* 11.40* 6.87*  CALCIUM  --  8.6 8.5  PHOS  --  5.1* 3.3    Recent Labs Lab 10/01/14 1620 10/01/14 1631 10/02/14 0251 10/03/14 0549  WBC 5.4  --  5.8 5.2  NEUTROABS 3.3  --   --   --   HGB 9.8* 10.5* 9.9* 10.2*  HCT 30.2* 31.0* 31.1* 32.2*  MCV 88.8  --  87.6 88.5  PLT 154  --  171 180

## 2014-10-03 NOTE — Consult Note (Signed)
Vascular Surgery Consultation  Reason for Consult: Gangrene right third toe  HPI: Jonathan Padilla is a 81 y.o. male who presents for evaluation of gangrene right third toe. Patient states that he injured his third toe inadvertently 2 or 3 weeks ago and it became painful and eventually dark and black. He now has dry gangrene of the third toe and vascular evaluation was requested. Patient walks generally with a walker. He does have a history of coronary artery disease with previous myocardial infarction and coronary artery bypass grafting. He also has a pacemaker in place.   Past Medical History  Diagnosis Date  . Hyperlipidemia   . Hypertension   . Congestive heart failure, unspecified   . Coronary atherosclerosis of native coronary artery   . 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 End stage renal disease     on HD MWF; [facility]  . Aortoiliac occlusive disease   . Secondary hyperparathyroidism   . Gout   . MI (myocardial infarction)   . Hemodialysis patient 1999  . Shortness of breath   . Pneumonia 08/09/2012   Past Surgical History  Procedure Laterality Date  . Coronary artery bypass graft  04/18/05    Edward Gerhardt,MD  . Ppm  11/18/05    Medtronic EnRhythm P1501-complete heart block  . Dialysis fistula creation    . Pacemaker generator change  08/05/12    MDT Adaptal L generator placd by Dr Allred  . Permanent pacemaker generator change N/A 08/05/2012    Procedure: PERMANENT PACEMAKER GENERATOR CHANGE;  Surgeon: Tamekia Rotter Allred, MD;  Location: [facility] CATH LAB;  Service: Cardiovascular;  Laterality: N/A;   History   Social History  . Marital Status: Widowed    Spouse Name: N/A  . Number of Children: N/A  . Years of Education: N/A   Social History Main Topics  . Smoking status: Former Smoker -- 1.00 packs/day for 50 years    Types: Cigarettes    Quit date: 06/09/1995  . Smokeless tobacco: Never Used  . Alcohol Use: No  . Drug Use: No  . Sexual  Activity: Not on file   Other Topics Concern  . None   Social History Narrative   History reviewed. No pertinent family history. No Known Allergies Prior to Admission medications   Medication Sig Start Date End Date Taking? Authorizing Provider  aspirin 81 MG tablet Take 81 mg by mouth daily.   Yes Historical Provider, MD  atorvastatin (LIPITOR) 40 MG tablet Take 40 mg by mouth daily.   Yes Historical Provider, MD  carvedilol (COREG) 6.25 MG tablet TAKE ONE TABLET BY MOUTH TWICE DAILY WITH FOOD 06/12/14  Yes Donald W Moore, MD  isosorbide mononitrate (IMDUR) 30 MG 24 hr tablet TAKE ONE TABLET BY MOUTH ONE TIME DAILY 07/06/14  Yes Mary-Margaret Martin, FNP  multivitamin (RENA-VIT) TABS tablet Take 1 tablet by mouth daily.   Yes Historical Provider, MD  mupirocin ointment (BACTROBAN) 2 % Apply topical to wound on toe TID. Cover with nonstick gauze Patient taking differently: Apply 1 application topically See admin instructions. Apply topical to wound on toe TID. Cover with nonstick gauze 09/28/14  Yes Tiffany A Gann, PA-C  pantoprazole (PROTONIX) 40 MG tablet TAKE ONE TABLET BY MOUTH ONE TIME DAILY 06/12/14  Yes Donald W Moore, MD  sevelamer (RENAGEL) 800 MG tablet Take 800 mg by mouth See admin instructions. Take 5 tabs once a day   Yes Historical Provider, MD    COLCRYS 0.6 MG tablet Take 1 tablet (0.6 mg total) by mouth daily. 10/02/14   Tiffany A Gann, PA-C  cyclobenzaprine (FLEXERIL) 5 MG tablet Take 1 tablet (5 mg total) by mouth 3 (three) times daily as needed for muscle spasms. 10/02/14   Tiffany A Gann, PA-C     Positive ROS: Denies active chest pain, dyspnea on exertion, PND, orthopnea. Patient is on chronic hemodialysis on Tuesday Thursday Saturday  All other systems have been reviewed and were otherwise negative with the exception of those mentioned in the HPI and as above.  Physical Exam: Filed Vitals:   10/03/14 1248  BP: 150/80  Pulse: 97  Temp: 98 F (36.7 C)  Resp: 16     General: Alert, no acute distress HEENT: Normal for age Cardiovascular: Regular rate and rhythm. Carotid pulses 2+, no bruits audible Respiratory: Clear to auscultation. No cyanosis, no use of accessory musculature GI: No organomegaly, abdomen is soft and non-tender Skin: No lesions in the area of chief complaint Neurologic: Sensation intact distally Psychiatric: Patient is competent for consent with normal mood and affect Musculoskeletal: No obvious deformities Extremities: Right leg with 2+ femoral and 2+ popliteal pulse palpable. No distal pulses palpable. 1+ edema lower third of leg and foot. Dry gangrene right third toe. Left leg with 2+ femoral 1-2+ popliteal pulse palpable and no distal pulses palpable. No infection or gangrene left foo  Imaging reviewed: Venous duplex exam yesterday revealed no DVT right leg   Assessment/Plan:  It appears patient has primarily tibial disease right leg. He has traumatic injury to right third toe which has resulted in dry gangrene. We will obtain arterial duplex exam of right leg this evening as well as ABIs. Have tentatively scheduled him for angiogram right leg with intervention if feasible by Dr. Brian Chen  in a.m. Discussed this with patient's daughter who is power of attorney and she will sign consent. Patient does not appear to be an operative candidate but possibly has a situation that can be improved with percutaneous intervention which may allow healing of third toe amputation   Tyreonna Czaplicki, MD 10/03/2014 4:49 PM      

## 2014-10-03 NOTE — Progress Notes (Addendum)
Right lower extremity arterial duplex with ABI completed.  Technically difficult study due to the patient's movements.  Duplex imaging reveals one area of 50-99% stenosis in the mid right femoral artery. VASCULAR LAB PRELIMINARY  ARTERIAL     RIGHT    LEFT    PRESSURE WAVEFORM  PRESSURE WAVEFORM  BRACHIAL Restricted arm band  BRACHIAL 138 triphasic  DP   DP    AT 255 monophasic AT 238 monophasic  PT 78 monophasic PT 35 Dampened monophasic  PER   PER    GREAT TOE  NA GREAT TOE  NA    RIGHT LEFT  ABI >1.0 >1.0     Alyze Lauf, RVT 10/03/2014, 6:33 PM

## 2014-10-03 NOTE — Telephone Encounter (Signed)
Learned patient was admitted to hospital

## 2014-10-03 NOTE — Progress Notes (Signed)
[facility] PROGRESS NOTE  Jonathan ApleyHenry S Padilla XBJ:[id number] DOB: 10/07/1927 DOA: 10/01/2014 PCP: Bennie PieriniMARTIN,MARY MARGARET, FNP  Assessment/Plan: Principal Problem:   Gangrene of toe - Consulted vascular surgery - Continue supportive therapy  Active Problems:   Essential hypertension - Patient on carvedilol, Imdur    ESRD (end stage renal disease) on dialysis - Nephrology on board and managing    CAD (coronary artery disease) - Stable no chest pain reported, on aspirin and statin    Gout - Stable    Dyslipidemia - Stable continue statin  Code Status: Full Family Communication: Discussed with family and patient at bedside Disposition Plan: Per vascular surgeon   Consultants:  Vascular surgery  Procedures:  Pending  Antibiotics:  Vancomycin  HPI/Subjective: Patient has no new complaints.  Objective: Filed Vitals:   10/03/14 1248  BP: 150/80  Pulse: 97  Temp: 98 F (36.7 C)  Resp: 16    Intake/Output Summary (Last 24 hours) at 10/03/14 1808 Last data filed at 10/03/14 1300  Gross per 24 hour  Intake    240 ml  Output   1000 ml  Net   -760 ml   Filed Weights   10/02/14 2000 10/02/14 2257 10/03/14 0500  Weight: 70.2 kg (154 lb 12.2 oz) 69.2 kg (152 lb 8.9 oz) 68.8 kg (151 lb 10.8 oz)    Exam:   General:  Patient in no acute distress, alert and awake  Cardiovascular: Regular rate and rhythm, no murmurs or rubs  Respiratory: Here to auscultation bilaterally, no wheezes  Abdomen: Soft, nondistended, nontender  Musculoskeletal: Necrotic right third toe   Data Reviewed: Basic Metabolic Panel:  Recent Labs Lab 10/01/14 1631 10/02/14 0251 10/03/14 0549  NA 134* 133* 135  K 3.4* 3.4* 3.6  CL 97 93* 97  CO2  --  23 25  GLUCOSE 85 80 90  BUN 40* 39* 18  CREATININE 11.00* 11.40* 6.87*  CALCIUM  --  8.6 8.5  PHOS  --  5.1* 3.3   Liver Function Tests:  Recent Labs Lab 10/02/14 0251 10/03/14 0549  AST 34  --   ALT 13  --   ALKPHOS  72  --   BILITOT 0.8  --   PROT 6.7  --   ALBUMIN 2.9* 2.9*   No results for input(s): LIPASE, AMYLASE in the last 168 hours. No results for input(s): AMMONIA in the last 168 hours. CBC:  Recent Labs Lab 10/01/14 1620 10/01/14 1631 10/02/14 0251 10/03/14 0549  WBC 5.4  --  5.8 5.2  NEUTROABS 3.3  --   --   --   HGB 9.8* 10.5* 9.9* 10.2*  HCT 30.2* 31.0* 31.1* 32.2*  MCV 88.8  --  87.6 88.5  PLT 154  --  171 180   Cardiac Enzymes: No results for input(s): CKTOTAL, CKMB, CKMBINDEX, TROPONINI in the last 168 hours. BNP (last 3 results) No results for input(s): BNP in the last 8760 hours.  ProBNP (last 3 results) No results for input(s): PROBNP in the last 8760 hours.  CBG: No results for input(s): GLUCAP in the last 168 hours.  Recent Results (from the past 240 hour(s))  Aerobic culture     Status: None   Collection Time: 09/28/14  5:06 PM  Result Value Ref Range Status   Aerobic Bacterial Culture Final report  Final   Result 1 Comment  Final    Comment: No growth in 36 - 48 hours.  Culture, blood (routine x 2)     Status:  None (Preliminary result)   Collection Time: 10/02/14  2:51 AM  Result Value Ref Range Status   Specimen Description BLOOD BLOOD LEFT FOREARM  Final   Special Requests BOTTLES DRAWN AEROBIC AND ANAEROBIC 5CC EA  Final   Culture   Final           BLOOD CULTURE RECEIVED NO GROWTH TO DATE CULTURE WILL BE HELD FOR 5 DAYS BEFORE ISSUING A FINAL NEGATIVE REPORT Performed at [facility]    Report Status PENDING  Incomplete  Culture, blood (routine x 2)     Status: None (Preliminary result)   Collection Time: 10/02/14  3:00 AM  Result Value Ref Range Status   Specimen Description BLOOD LEFT WRIST  Final   Special Requests BOTTLES DRAWN AEROBIC ONLY 4.5CC  Final   Culture   Final           BLOOD CULTURE RECEIVED NO GROWTH TO DATE CULTURE WILL BE HELD FOR 5 DAYS BEFORE ISSUING A FINAL NEGATIVE REPORT Performed at [facility]    Report  Status PENDING  Incomplete  Surgical pcr screen     Status: Abnormal   Collection Time: 10/02/14  9:07 AM  Result Value Ref Range Status   MRSA, PCR NEGATIVE NEGATIVE Final   Staphylococcus aureus POSITIVE (A) NEGATIVE Final    Comment:        The Xpert SA Assay (FDA approved for NASAL specimens in patients over 87 years of age), is one component of a comprehensive surveillance program.  Test performance has been validated by [facility] for patients greater than or equal to 46 year old. It is not intended to diagnose infection nor to guide or monitor treatment.      Studies: Dg Foot Complete Right  Oct 09, 2014   CLINICAL DATA:  Irritation at the right first and third toes. Initial encounter.  EXAM: RIGHT FOOT COMPLETE - 3+ VIEW  COMPARISON:  Right foot radiographs performed 09/04/2014  FINDINGS: There is no evidence of fracture or dislocation. The joint spaces are preserved. There is no evidence of talar subluxation; the subtalar joint is unremarkable in appearance.  Dorsal soft tissue swelling is noted at the forefoot. Diffuse vascular calcifications are seen.  IMPRESSION: No evidence of fracture or dislocation.   Electronically Signed   By: Roanna Raider M.D.   On: 10-09-14 23:34    Scheduled Meds: . aspirin EC  81 mg Oral Daily  . atorvastatin  40 mg Oral Daily  . carvedilol  6.25 mg Oral BID WC  . Chlorhexidine Gluconate Cloth  6 each Topical Daily  . darbepoetin (ARANESP) injection - DIALYSIS  60 mcg Intravenous Q Tue-HD  . doxercalciferol  4 mcg Intravenous Q T,Th,Sa-HD  . heparin  5,000 Units Subcutaneous 3 times per day  . isosorbide mononitrate  30 mg Oral Daily  . mupirocin ointment  1 application Nasal BID  . pantoprazole  40 mg Oral Daily  . vancomycin  750 mg Intravenous Q T,Th,Sa-HD   Continuous Infusions:    Time spent: > 35 minutes    Penny Pia  [facility] Pager [phone]. If 7PM-7AM, please contact night-coverage at [email or web address], password  [facility] 10/03/2014, 6:08 PM  LOS: 1 day

## 2014-10-03 NOTE — Progress Notes (Signed)
Pt very confused combative swinging at staff with cane security called took cane,Dr Cena BentonVega called ordered ativan0.5 given.Pt incont of stool in bed.0930 son arrived pt much calmer.

## 2014-10-03 NOTE — Progress Notes (Signed)
VASCULAR LAB PRELIMINARY  PRELIMINARY  PRELIMINARY  PRELIMINARY  Bilateral lower extremity venous duplex  completed.    Preliminary report:  Bilateral:  No evidence of DVT, superficial thrombosis, or Baker's Cyst.   Quantel Mcinturff, RVT 10/03/2014, 3:30 PM

## 2014-10-04 ENCOUNTER — Encounter (HOSPITAL_COMMUNITY)
Admission: EM | Disposition: A | Discharge status: Home Health | Payer: Self-pay | Source: Home / Self Care | Attending: Family Medicine

## 2014-10-04 DIAGNOSIS — I70261 Atherosclerosis of native arteries of extremities with gangrene, right leg: Secondary | ICD-10-CM

## 2014-10-04 HISTORY — PX: LOWER EXTREMITY ANGIOGRAM: SHX5508

## 2014-10-04 LAB — RENAL FUNCTION PANEL
ANION GAP: 14 (ref 5–15)
Albumin: 2.8 g/dL — ABNORMAL LOW (ref 3.5–5.2)
BUN: 27 mg/dL — ABNORMAL HIGH (ref 6–23)
CHLORIDE: 99 mmol/L (ref 96–112)
CO2: 24 mmol/L (ref 19–32)
CREATININE: 9.16 mg/dL — AB (ref 0.50–1.35)
Calcium: 8.9 mg/dL (ref 8.4–10.5)
GFR calc Af Amer: 5 mL/min — ABNORMAL LOW (ref >90)
GFR, EST NON AFRICAN AMERICAN: 5 mL/min — AB (ref >90)
Glucose, Bld: 125 mg/dL — ABNORMAL HIGH (ref 70–99)
Phosphorus: 4.6 mg/dL (ref 2.3–4.6)
Potassium: 4 mmol/L (ref 3.5–5.1)
Sodium: 137 mmol/L (ref 135–145)

## 2014-10-04 LAB — PROTIME-INR
INR: 1.32 (ref 0.00–1.49)
Prothrombin Time: 16.5 seconds — ABNORMAL HIGH (ref 11.6–15.2)

## 2014-10-04 LAB — CREATININE, SERUM
CREATININE: 8.74 mg/dL — AB (ref 0.50–1.35)
GFR, EST AFRICAN AMERICAN: 6 mL/min — AB (ref >90)
GFR, EST NON AFRICAN AMERICAN: 5 mL/min — AB (ref >90)

## 2014-10-04 LAB — CBC
HCT: 32.9 % — ABNORMAL LOW (ref 39.0–52.0)
HCT: 33.2 % — ABNORMAL LOW (ref 39.0–52.0)
Hemoglobin: 10.5 g/dL — ABNORMAL LOW (ref 13.0–17.0)
Hemoglobin: 10.5 g/dL — ABNORMAL LOW (ref 13.0–17.0)
MCH: 29 pg (ref 26.0–34.0)
MCH: 28.3 pg (ref 26.0–34.0)
MCHC: 31.9 g/dL (ref 30.0–36.0)
MCHC: 31.6 g/dL (ref 30.0–36.0)
MCV: 90.9 fL (ref 78.0–100.0)
MCV: 89.5 fL (ref 78.0–100.0)
PLATELETS: 155 10*3/uL (ref 150–400)
Platelets: 163 10*3/uL (ref 150–400)
RBC: 3.62 MIL/uL — AB (ref 4.22–5.81)
RBC: 3.71 MIL/uL — ABNORMAL LOW (ref 4.22–5.81)
RDW: 15.9 % — ABNORMAL HIGH (ref 11.5–15.5)
RDW: 15.9 % — AB (ref 11.5–15.5)
WBC: 5.3 10*3/uL (ref 4.0–10.5)
WBC: 4.5 10*3/uL (ref 4.0–10.5)

## 2014-10-04 SURGERY — ANGIOGRAM, LOWER EXTREMITY
Anesthesia: LOCAL

## 2014-10-04 MED ORDER — HEPARIN (PORCINE) IN NACL 2-0.9 UNIT/ML-% IJ SOLN
INTRAMUSCULAR | Status: AC
  Filled 2014-10-04: qty 1000

## 2014-10-04 MED ORDER — ISOSORBIDE MONONITRATE ER 30 MG PO TB24
30.0000 mg | ORAL_TABLET | Freq: Every day | ORAL | Status: DC
Start: 2014-10-05 — End: 2014-10-05
  Filled 2014-10-04: qty 1

## 2014-10-04 MED ORDER — OXYCODONE-ACETAMINOPHEN 5-325 MG PO TABS: 1.0000 | ORAL_TABLET | Freq: Four times a day (QID) | ORAL | Status: DC | PRN

## 2014-10-04 MED ORDER — DOXERCALCIFEROL 4 MCG/2ML IV SOLN
INTRAVENOUS | Status: AC
  Filled 2014-10-04: qty 2

## 2014-10-04 MED ORDER — LIDOCAINE-PRILOCAINE 2.5-2.5 % EX CREA
1.0000 "application " | TOPICAL_CREAM | CUTANEOUS | Status: DC | PRN
  Filled 2014-10-04: qty 5

## 2014-10-04 MED ORDER — HEPARIN SODIUM (PORCINE) 5000 UNIT/ML IJ SOLN: 5000.0000 [IU] | Freq: Three times a day (TID) | INTRAMUSCULAR | Status: DC

## 2014-10-04 MED ORDER — SODIUM CHLORIDE 0.9 % IJ SOLN: 3.0000 mL | INTRAMUSCULAR | Status: DC | PRN

## 2014-10-04 MED ORDER — SODIUM CHLORIDE 0.9 % IV SOLN: 250.0000 mL | INTRAVENOUS | Status: DC | PRN

## 2014-10-04 MED ORDER — PENTAFLUOROPROP-TETRAFLUOROETH EX AERO: 1.0000 "application " | INHALATION_SPRAY | CUTANEOUS | Status: DC | PRN

## 2014-10-04 MED ORDER — MORPHINE SULFATE 2 MG/ML IJ SOLN: 2.0000 mg | INTRAMUSCULAR | Status: DC | PRN

## 2014-10-04 MED ORDER — HEPARIN SODIUM (PORCINE) 1000 UNIT/ML DIALYSIS: 20.0000 [IU]/kg | INTRAMUSCULAR | Status: DC | PRN

## 2014-10-04 MED ORDER — ACETAMINOPHEN 325 MG PO TABS: 650.0000 mg | ORAL_TABLET | ORAL | Status: DC | PRN

## 2014-10-04 MED ORDER — HEPARIN SODIUM (PORCINE) 1000 UNIT/ML DIALYSIS: 1000.0000 [IU] | INTRAMUSCULAR | Status: DC | PRN

## 2014-10-04 MED ORDER — ONDANSETRON HCL 4 MG/2ML IJ SOLN: 4.0000 mg | Freq: Four times a day (QID) | INTRAMUSCULAR | Status: DC | PRN

## 2014-10-04 MED ORDER — SODIUM CHLORIDE 0.9 % IV SOLN: 100.0000 mL | INTRAVENOUS | Status: DC | PRN

## 2014-10-04 MED ORDER — LIDOCAINE HCL (PF) 1 % IJ SOLN
INTRAMUSCULAR | Status: AC
  Filled 2014-10-04: qty 30

## 2014-10-04 MED ORDER — SODIUM CHLORIDE 0.9 % IJ SOLN
3.0000 mL | Freq: Two times a day (BID) | INTRAMUSCULAR | Status: DC
  Administered 2014-10-04 – 2014-10-05 (×3): 3 mL via INTRAVENOUS

## 2014-10-04 MED ORDER — LIDOCAINE HCL (PF) 1 % IJ SOLN: 5.0000 mL | INTRAMUSCULAR | Status: DC | PRN

## 2014-10-04 MED ORDER — NEPRO/CARBSTEADY PO LIQD
237.0000 mL | ORAL | Status: DC | PRN
  Filled 2014-10-04: qty 237

## 2014-10-04 NOTE — Op Note (Signed)
OPERATIVE NOTE   PROCEDURE: 1.  Bilateral common femoral artery cannulation under ultrasound guidance 2.  Placement of catheter in aorta 3.  Aortogram 4.  Bilateral leg runoff via catheter  PRE-OPERATIVE DIAGNOSIS: Right third toe gangrene  POST-OPERATIVE DIAGNOSIS: same as above   SURGEON: Leonides SakeBrian Natsumi Whitsitt, MD  ANESTHESIA: conscious sedation  ESTIMATED BLOOD LOSS: 30 cc  CONTRAST: 160 cc  FINDING(S):  Aorta: tortuous, evidence of aortobi-iliac graft with proximal pseudoaneurysm, anastomosis >5 cm distal to renal arteries  Superior mesenteric artery: Patent Celiac artery: Not well visualized, ? splenic artery evident  Right Left  RA Patent, without nephrogram Occluded  CIA Patent, heavily calcified Patent, heavily calcified  EIA Patent, heavily calcified Patent with >90% stenosis suggested on aortogram, heavily calcified  IIA Patent Patent  CFA Patent, heavily calcified Patent, heavily calcified  SFA Patent, >90% stenosis in mid-segment, heavily calcified Patent, heavily calcified, 75-90% stenosis  PFA Patent Patent  Pop Patent Likely patent with diffuse disease, washed out due to timing  Trif Patent but heavily diseased, miniscule Washed out due to timing  AT Patent proximally, occluded distally Washed out due to timing  Pero Patent proximally but heavily diseased, miniscule Washed out due to timing  PT Occluded Occluded   SPECIMEN(S):  none  INDICATIONS:   Jonathan Padilla is a 81 y.o. male who presents with right third toe gangrene.  The patient presents for: aortogram, bilateral leg runoff, and possible intervention right leg.  I discussed with the patient the nature of angiographic procedures, especially the limited patencies of any endovascular intervention.  The patient is aware of that the risks of an angiographic procedure include but are not limited to: bleeding, infection, access site complications, renal failure, embolization, rupture of vessel, dissection,  possible need for emergent surgical intervention, possible need for surgical procedures to treat the patient's pathology, and stroke and death.  The patient is aware of the risks and agrees to proceed.  DESCRIPTION: After full informed consent was obtained from the patient, the patient was brought back to the angiography suite.  The patient was placed supine upon the angiography table and connected to monitoring equipment.  The patient was then given conscious sedation, the amounts of which are documented in the patient's chart.  The patient was prepped and drape in the standard fashion for an angiographic procedure.  At this point, attention was turned to the left groin.  Under ultrasound guidance, the subcutaneous tissue surrounding the left common femoral artery was anesthesized with 1% lidocaine with epinephrine.  The artery was then cannulated with a micropuncture needle with extreme difficulty due to heavy calcification.  The microwire was advanced into the iliac arterial system.  The artery was so calcified but microsheath and dilator would not advance past the calcification.  I pulled out the wire and held pressure for 5 minutes.  I then tried to cannulate the common femoral artery more proximally.  The calcification was so dense even with leaning on the artery with my weight, it would not puncture the artery.  I stopped and held pressure for a couple of minutes.  No hematoma occurred.  I turned my attention to the left groin.  Under ultrasound guidance, the subcutaneous tissue surrounding the right common femoral artery was anesthesized with 1% lidocaine with epinephrine.  The artery was then cannulated with a micropuncture needle with extreme severity.  The microwire was advanced into the iliac arterial system.  The needle was exchanged for a microsheath, which was loaded into  the common femoral artery over the wire.  The microwire was exchanged for a [facility] wire which was advanced into the aorta.   The microsheath was then exchanged for a 5-Fr sheath which was loaded into the common femoral artery.  The Omniflush catheter was then loaded over the wire up to the level of L1.  The catheter was connected to the power injector circuit.  After de-airring and de-clotting the circuit, a power injector aortogram was completed.  It became evident that this patient had an aortic graft in place.  This aortic graft was obviously placed at a different practice as the infrarenal aorta had considerable more length than the usual [facility] practice.  I then pulled the catheter down to distal aorta and performed a bilateral leg runoff via the catheter.  Unfortunately, the left external iliac artery caused a timing delay in the two legs.    I elected to not reimage the left leg, as this would likely require sheath placement in the left common femoral artery which likely impossible without open access after a iliofemoral endarterectomy with patch angioplasty.  I repeated stationary images of the right leg from the trifurcation down.  This confirms essential loss of the tibial arteries without only collateral flow distally in this right leg.  Based on these images, I doubt a toe amputation will heal and an above-knee amputation may be needed.   COMPLICATIONS: none  CONDITION: stable   Leonides Sake, MD [facility] [place] Office: [phone] Pager: [phone]  10/04/2014, 1:37 PM

## 2014-10-04 NOTE — H&P (View-Only) (Signed)
Vascular Surgery Consultation  Reason for Consult: Gangrene right third toe  HPI: Jonathan Padilla is a 81 y.o. male who presents for evaluation of gangrene right third toe. Patient states that he injured his third toe inadvertently 2 or 3 weeks ago and it became painful and eventually dark and black. He now has dry gangrene of the third toe and vascular evaluation was requested. Patient walks generally with a walker. He does have a history of coronary artery disease with previous myocardial infarction and coronary artery bypass grafting. He also has a pacemaker in place.   Past Medical History  Diagnosis Date  . Hyperlipidemia   . Hypertension   . Congestive heart failure, unspecified   . Coronary atherosclerosis of native coronary artery   . 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 End stage renal disease     on HD MWF; [facility]  . Aortoiliac occlusive disease   . Secondary hyperparathyroidism   . Gout   . MI (myocardial infarction)   . Hemodialysis patient 1999  . Shortness of breath   . Pneumonia 08/09/2012   Past Surgical History  Procedure Laterality Date  . Coronary artery bypass graft  04/18/05    Rachell CiproEdward Gerhardt,MD  . Ppm  11/18/05    Med[employer] EnRhythm P1501-complete heart block  . Dialysis fistula creation    . Pacemaker generator change  08/05/12    MDT Adaptal L generator placd by Dr Johney FrameAllred  . Permanent pacemaker generator change N/A 08/05/2012    Procedure: PERMANENT PACEMAKER GENERATOR CHANGE;  Surgeon: Hillis RangeJames Allred, MD;  [facility];  Service: Cardiovascular;  Laterality: N/A;   History   Social History  . Marital Status: Widowed    Spouse Name: N/A  . Number of Children: N/A  . Years of Education: N/A   Social History Main Topics  . Smoking status: Former Smoker -- 1.00 packs/day for 50 years    Types: Cigarettes    Quit date: 06/09/1995  . Smokeless tobacco: Never Used  . Alcohol Use: No  . Drug Use: No  . Sexual  Activity: Not on file   Other Topics Concern  . None   Social History Narrative   History reviewed. No pertinent family history. No Known Allergies Prior to Admission medications   Medication Sig Start Date End Date Taking? Authorizing Provider  aspirin 81 MG tablet Take 81 mg by mouth daily.   Yes Historical Provider, MD  atorvastatin (LIPITOR) 40 MG tablet Take 40 mg by mouth daily.   Yes Historical Provider, MD  carvedilol (COREG) 6.25 MG tablet TAKE ONE TABLET BY MOUTH TWICE DAILY WITH FOOD 06/12/14  Yes Ernestina Pennaonald W Moore, MD  isosorbide mononitrate (IMDUR) 30 MG 24 hr tablet TAKE ONE TABLET BY MOUTH ONE TIME DAILY 07/06/14  Yes Mary-Margaret Daphine DeutscherMartin, FNP  multivitamin (RENA-VIT) TABS tablet Take 1 tablet by mouth daily.   Yes Historical Provider, MD  mupirocin ointment (BACTROBAN) 2 % Apply topical to wound on toe TID. Cover with nonstick gauze Patient taking differently: Apply 1 application topically See admin instructions. Apply topical to wound on toe TID. Cover with nonstick gauze 09/28/14  Yes Tiffany A Gann, PA-C  pantoprazole (PROTONIX) 40 MG tablet TAKE ONE TABLET BY MOUTH ONE TIME DAILY 06/12/14  Yes Ernestina Pennaonald W Moore, MD  sevelamer (RENAGEL) 800 MG tablet Take 800 mg by mouth See admin instructions. Take 5 tabs once a day   Yes Historical Provider, MD  COLCRYS 0.6 MG tablet Take 1 tablet (0.6 mg total) by mouth daily. 10/02/14   Tiffany A Gann, PA-C  cyclobenzaprine (FLEXERIL) 5 MG tablet Take 1 tablet (5 mg total) by mouth 3 (three) times daily as needed for muscle spasms. 10/02/14   Tiffany A Gann, PA-C     Positive ROS: Denies active chest pain, dyspnea on exertion, PND, orthopnea. Patient is on chronic hemodialysis on Tuesday Thursday Saturday  All other systems have been reviewed and were otherwise negative with the exception of those mentioned in the HPI and as above.  Physical Exam: Filed Vitals:   10/03/14 1248  BP: 150/80  Pulse: 97  Temp: 98 F (36.7 C)  Resp: 16     General: Alert, no acute distress HEENT: Normal for age Cardiovascular: Regular rate and rhythm. Carotid pulses 2+, no bruits audible Respiratory: Clear to auscultation. No cyanosis, no use of accessory musculature GI: No organomegaly, abdomen is soft and non-tender Skin: No lesions in the area of chief complaint Neurologic: Sensation intact distally Psychiatric: Patient is competent for consent with normal mood and affect Musculoskeletal: No obvious deformities Extremities: Right leg with 2+ femoral and 2+ popliteal pulse palpable. No distal pulses palpable. 1+ edema lower third of leg and foot. Dry gangrene right third toe. Left leg with 2+ femoral 1-2+ popliteal pulse palpable and no distal pulses palpable. No infection or gangrene left foo  Imaging reviewed: Venous duplex exam yesterday revealed no DVT right leg   Assessment/Plan:  It appears patient has primarily tibial disease right leg. He has traumatic injury to right third toe which has resulted in dry gangrene. We will obtain arterial duplex exam of right leg this evening as well as ABIs. Have tentatively scheduled him for angiogram right leg with intervention if feasible by Dr. Leonides Sake  in a.m. Discussed this with patient's daughter who is power of attorney and she will sign consent. Patient does not appear to be an operative candidate but possibly has a situation that can be improved with percutaneous intervention which may allow healing of third toe amputation   Josephina Gip, MD 10/03/2014 4:49 PM

## 2014-10-04 NOTE — Progress Notes (Signed)
Patient confused and gets agitated when disturbed. Refused his vital signs and lab draws this morning.

## 2014-10-04 NOTE — Interval H&P Note (Signed)
[facility]  History and Physical Update  The patient was interviewed and re-examined.  The patient's previous History and Physical has been reviewed and is unchanged from Dr. Candie ChromanLawson's consult.  There is no change in the plan of care: Aortogram, bilateral leg runoff, and possible right leg intervention.  I discussed with the patient the nature of angiographic procedures, especially the limited patencies of any endovascular intervention.  The patient is aware of that the risks of an angiographic procedure include but are not limited to: bleeding, infection, access site complications, renal failure, embolization, rupture of vessel, dissection, possible need for emergent surgical intervention, possible need for surgical procedures to treat the patient's pathology, anaphylactic reaction to contrast, and stroke and death.  The patient is aware of the risks and agrees to proceed.Jonathan Padilla.  Brian Chen, MD [facility] [place] Office: [phone] Pager: [phone]  10/04/2014, 12:08 PM

## 2014-10-04 NOTE — Progress Notes (Signed)
Pt assigned to 2w18.  Report given to Pricilla and pt transferred to unit with rt groin a level 0

## 2014-10-04 NOTE — Progress Notes (Signed)
Change in bed assignment.  Pt not transferred to 5 Nth but now has a new bed requested for a different unit.  Continued to monitor and assess pt in Cath Holding bay 3.

## 2014-10-04 NOTE — Progress Notes (Signed)
[facility] PROGRESS NOTE  Jonathan Padilla ZOX:[id number] DOB: 20-Dec-1927 DOA: 10/01/2014 PCP: Bennie Pierini, FNP  Assessment/Plan: Principal Problem:   Gangrene of toe - Consulted vascular surgery further work up with ABI requested by surgeon.  - Continue supportive therapy - Will plan for physical therapy to evaluate patient after any procedures that may be warranted.  Active Problems:   Essential hypertension - Patient on carvedilol, Imdur    ESRD (end stage renal disease) on dialysis - Nephrology on board and managing    CAD (coronary artery disease) - Stable no chest pain reported, on aspirin and statin    Gout - Stable    Dyslipidemia - Stable continue statin  Code Status: Full Family Communication: Discussed with family and patient at bedside Disposition Plan: Per vascular surgeon   Consultants:  Vascular surgery  Procedures:  Pending  Antibiotics:  Vancomycin  HPI/Subjective: Patient has no new complaints. No acute issues reported overnight.  Objective: Filed Vitals:   10/04/14 1540  BP: 163/91  Pulse: 79  Temp:   Resp: 18   No intake or output data in the 24 hours ending 10/04/14 1604 Filed Weights   10/02/14 2257 10/03/14 0500 10/04/14 0602  Weight: 69.2 kg (152 lb 8.9 oz) 68.8 kg (151 lb 10.8 oz) 69.8 kg (153 lb 14.1 oz)    Exam:   General:  Patient in no acute distress, alert and awake  Cardiovascular: Regular rate and rhythm, no murmurs or rubs  Respiratory: Here to auscultation bilaterally, no wheezes  Abdomen: Soft, nondistended, nontender  Musculoskeletal: Necrotic right third toe   Data Reviewed: Basic Metabolic Panel:  Recent Labs Lab 10/01/14 1631 10/02/14 0251 10/03/14 0549  NA 134* 133* 135  K 3.4* 3.4* 3.6  CL 97 93* 97  CO2  --  23 25  GLUCOSE 85 80 90  BUN 40* 39* 18  CREATININE 11.00* 11.40* 6.87*  CALCIUM  --  8.6 8.5  PHOS  --  5.1* 3.3   Liver Function Tests:  Recent Labs Lab  10/02/14 0251 10/03/14 0549  AST 34  --   ALT 13  --   ALKPHOS 72  --   BILITOT 0.8  --   PROT 6.7  --   ALBUMIN 2.9* 2.9*   No results for input(s): LIPASE, AMYLASE in the last 168 hours. No results for input(s): AMMONIA in the last 168 hours. CBC:  Recent Labs Lab 10/01/14 1620 10/01/14 1631 10/02/14 0251 10/03/14 0549 10/04/14 1130  WBC 5.4  --  5.8 5.2 5.3  NEUTROABS 3.3  --   --   --   --   HGB 9.8* 10.5* 9.9* 10.2* 10.5*  HCT 30.2* 31.0* 31.1* 32.2* 32.9*  MCV 88.8  --  87.6 88.5 90.9  PLT 154  --  171 180 163   Cardiac Enzymes: No results for input(s): CKTOTAL, CKMB, CKMBINDEX, TROPONINI in the last 168 hours. BNP (last 3 results) No results for input(s): BNP in the last 8760 hours.  ProBNP (last 3 results) No results for input(s): PROBNP in the last 8760 hours.  CBG: No results for input(s): GLUCAP in the last 168 hours.  Recent Results (from the past 240 hour(s))  Aerobic culture     Status: None   Collection Time: 09/28/14  5:06 PM  Result Value Ref Range Status   Aerobic Bacterial Culture Final report  Final   Result 1 Comment  Final    Comment: No growth in 36 - 48 hours.  Culture, blood (  routine x 2)     Status: None (Preliminary result)   Collection Time: 10/02/14  2:51 AM  Result Value Ref Range Status   Specimen Description BLOOD BLOOD LEFT FOREARM  Final   Special Requests BOTTLES DRAWN AEROBIC AND ANAEROBIC 5CC EA  Final   Culture   Final           BLOOD CULTURE RECEIVED NO GROWTH TO DATE CULTURE WILL BE HELD FOR 5 DAYS BEFORE ISSUING A FINAL NEGATIVE REPORT Performed at [facility]    Report Status PENDING  Incomplete  Culture, blood (routine x 2)     Status: None (Preliminary result)   Collection Time: 10/02/14  3:00 AM  Result Value Ref Range Status   Specimen Description BLOOD LEFT WRIST  Final   Special Requests BOTTLES DRAWN AEROBIC ONLY 4.5CC  Final   Culture   Final           BLOOD CULTURE RECEIVED NO GROWTH TO DATE  CULTURE WILL BE HELD FOR 5 DAYS BEFORE ISSUING A FINAL NEGATIVE REPORT Performed at [facility]    Report Status PENDING  Incomplete  Surgical pcr screen     Status: Abnormal   Collection Time: 10/02/14  9:07 AM  Result Value Ref Range Status   MRSA, PCR NEGATIVE NEGATIVE Final   Staphylococcus aureus POSITIVE (A) NEGATIVE Final    Comment:        The Xpert SA Assay (FDA approved for NASAL specimens in patients over 79 years of age), is one component of a comprehensive surveillance program.  Test performance has been validated by [facility] for patients greater than or equal to 79 year old. It is not intended to diagnose infection nor to guide or monitor treatment.      Studies: No results found.  Scheduled Meds: . aspirin EC  81 mg Oral Daily  . atorvastatin  40 mg Oral Daily  . carvedilol  6.25 mg Oral BID WC  . Chlorhexidine Gluconate Cloth  6 each Topical Daily  . darbepoetin (ARANESP) injection - DIALYSIS  60 mcg Intravenous Q Tue-HD  . doxercalciferol  4 mcg Intravenous Q T,Th,Sa-HD  . heparin  5,000 Units Subcutaneous 3 times per day  . heparin  5,000 Units Subcutaneous 3 times per day  . isosorbide mononitrate  30 mg Oral Daily  . mupirocin ointment  1 application Nasal BID  . pantoprazole  40 mg Oral Daily  . sodium chloride  3 mL Intravenous Q12H  . vancomycin  750 mg Intravenous Q T,Th,Sa-HD   Continuous Infusions:    Time spent: > 35 minutes    Jonathan Padilla, Jonathan Padilla  [facility] Pager [phone]. If 7PM-7AM, please contact night-coverage at [email or web address], password [email or web address] 10/04/2014, 4:04 PM  LOS: 2 days

## 2014-10-04 NOTE — Progress Notes (Signed)
Report received from cath lab at 1455 and pt arrived to the unit at 1525. Pt alert and verbally responsive; VSS, telemetry applied and verified; right groin site level 0, no bruising, hematoma or active bleeding noted. Clean, dry and intact dsg to site; pt oriented to room and call light; pt educated on bedrest for 4 hours which ends at 1900 and the need to keep RLE flat till bedrest is over; pt voices understanding. Pt in bed sleeping comfortably with call light within reach. Will continue to monitor per protocol. Arabella MerlesP. Amo Shameek Nyquist RN.

## 2014-10-04 NOTE — Progress Notes (Signed)
1800 report given off to dialysis RN and at 1925 pt transported off unit to dialysis. Will report off to incoming RN. Arabella MerlesP. Amo Via Rosado RN.

## 2014-10-04 NOTE — Procedures (Signed)
I was present at this session.  I have reviewed the session itself and made appropriate changes.  bfr 400,  Access press ok.  bp 100-120s  Jonathan Padilla L 4/28/20169:56 PM

## 2014-10-04 NOTE — Progress Notes (Signed)
5 fr sheath removed from RFA and direct pressure maintained for 20 minutes resulting in complete hemostasis to site with no bleeding or hematoma.  Pressure dsg applied and post instructions given.  Pt has restless leg syndrome so rt leg was sheeted to prevent movement.  No change in distal pulses.  Report given to Nettie ElmSylvia and pt transported to 5N 23

## 2014-10-04 NOTE — Progress Notes (Signed)
Pt bedrest over, pt remains stable in room, right groin site remains level 0 with clean, dry and intact dsg. No active drainage, bleeding, bruising or hematoma noted. Pt sitting up to side of bed eating dinner with family at bedside. Call light within reach and will continue to monitor pt quietly. Arabella MerlesP. Amo Tamaka Sawin RN.

## 2014-10-05 ENCOUNTER — Encounter (HOSPITAL_COMMUNITY): Payer: Self-pay | Admitting: Vascular Surgery

## 2014-10-05 LAB — HEPATITIS B CORE ANTIBODY, TOTAL: HEP B C TOTAL AB: NONREACTIVE

## 2014-10-05 LAB — CBC
HCT: 34.2 % — ABNORMAL LOW (ref 39.0–52.0)
Hemoglobin: 10.8 g/dL — ABNORMAL LOW (ref 13.0–17.0)
MCH: 28.6 pg (ref 26.0–34.0)
MCHC: 31.6 g/dL (ref 30.0–36.0)
MCV: 90.7 fL (ref 78.0–100.0)
PLATELETS: 148 10*3/uL — AB (ref 150–400)
RBC: 3.77 MIL/uL — ABNORMAL LOW (ref 4.22–5.81)
RDW: 16.1 % — ABNORMAL HIGH (ref 11.5–15.5)
WBC: 6.4 10*3/uL (ref 4.0–10.5)

## 2014-10-05 LAB — RENAL FUNCTION PANEL
Albumin: 3 g/dL — ABNORMAL LOW (ref 3.5–5.2)
Anion gap: 15 (ref 5–15)
BUN: 21 mg/dL (ref 6–23)
CALCIUM: 9 mg/dL (ref 8.4–10.5)
CHLORIDE: 102 mmol/L (ref 96–112)
CO2: 22 mmol/L (ref 19–32)
Creatinine, Ser: 8.05 mg/dL — ABNORMAL HIGH (ref 0.50–1.35)
GFR calc Af Amer: 6 mL/min — ABNORMAL LOW (ref >90)
GFR calc non Af Amer: 5 mL/min — ABNORMAL LOW (ref >90)
GLUCOSE: 131 mg/dL — AB (ref 70–99)
PHOSPHORUS: 4.3 mg/dL (ref 2.3–4.6)
Potassium: 4.2 mmol/L (ref 3.5–5.1)
Sodium: 139 mmol/L (ref 135–145)

## 2014-10-05 LAB — HEPATITIS B SURFACE ANTIBODY,QUALITATIVE: Hep B S Ab: POSITIVE — AB

## 2014-10-05 LAB — HEPATITIS B SURFACE ANTIGEN: Hepatitis B Surface Ag: NEGATIVE

## 2014-10-05 MED ORDER — HEPARIN SODIUM (PORCINE) 1000 UNIT/ML DIALYSIS
20.0000 [IU]/kg | INTRAMUSCULAR | Status: DC | PRN
  Filled 2014-10-05: qty 2

## 2014-10-05 NOTE — Progress Notes (Signed)
Admit: 10/01/2014 LOS: 3  28M ESRD on HD with necrotic 3rd toe and PAD  Subjective:  HD yesterday, 3 L UF. Uneventful. Post weight 67.1 kg  Angiogram yesterday with severe PAD with no readily remedial target.   plan for expectant management and necrotic toe and" only if worsened. Wants to go home today.   04/28 0701 - 04/29 0700 In: 222 [P.O.:222] Out: 3000   Filed Weights   10/04/14 1940 10/04/14 2320 10/05/14 0332  Weight: 70.3 kg (154 lb 15.7 oz) 67.1 kg (147 lb 14.9 oz) 65 kg (143 lb 4.8 oz)    Scheduled Meds: . aspirin EC  81 mg Oral Daily  . atorvastatin  40 mg Oral Daily  . carvedilol  6.25 mg Oral BID WC  . Chlorhexidine Gluconate Cloth  6 each Topical Daily  . darbepoetin (ARANESP) injection - DIALYSIS  60 mcg Intravenous Q Tue-HD  . doxercalciferol  4 mcg Intravenous Q T,Th,Sa-HD  . heparin  5,000 Units Subcutaneous 3 times per day  . isosorbide mononitrate  30 mg Oral QHS  . mupirocin ointment  1 application Nasal BID  . pantoprazole  40 mg Oral Daily  . sodium chloride  3 mL Intravenous Q12H  . vancomycin  750 mg Intravenous Q T,Th,Sa-HD   Continuous Infusions:  PRN Meds:.sodium chloride, acetaminophen, cyclobenzaprine, morphine injection, ondansetron **OR** ondansetron (ZOFRAN) IV, oxyCODONE-acetaminophen, sodium chloride  Current Labs: reviewed    Physical Exam:  Blood pressure 149/87, pulse 88, temperature 97.8 F (36.6 C), temperature source Axillary, resp. rate 18, height 6' (1.829 m), weight 65 kg (143 lb 4.8 oz), SpO2 99 %. GEN: NAD, think, frail ENT: NCAT, poor dentition EYES: EOMI CV: RRR PULM: CTAB ABD: s/nt/nd SKIN: R 3rd toe with distal necrosis/blackening EXT:no LEE  Outpt HD Orders Unit: [facility] Days: THS Time: 3h Dialyzer: Revaclear EDW: 69.5kg K/Ca: 2/2.5 Access: AVF L FA Needle Size: 15g BFR/DFR: 400/600 UF Proflie: none VDRA: Hectorol qTx EPO: Epogen 4000 units IVP qTx IV Fe: none Heparin: 2000 units IVB qTx Most  Recent Phos / PTH: ?  A/P 1. ESRD:  1. THS [facility] via R FA AVG 15g 2. HD on schedule 3. 4K Bath 2/2 low K 4. No inpatient nephrology needs; stable for transition to outpatient dialysis  2. HTN/Vol:  1. Stable, cont home meds 2. EDW 69.5kg  as outpatient but currently under because of acute illness, new EDW should be 67 kg  3. Anemia:  1. Cont ESA, use aranesp 60 qWk 4. MBD: cont Hectorol4qTx, no binder or sensipar as outpt, check Phos 5. Necrotic R 3rd toe + PAD:  no percutaneous or procedural interventions warranted a current time. Sabra Heck MD 10/05/2014, 9:54 AM   Recent Labs Lab 10/02/14 0251 10/03/14 0549 10/04/14 1823 10/04/14 2100  NA 133* 135  --  137  K 3.4* 3.6  --  4.0  CL 93* 97  --  99  CO2 23 25  --  24  GLUCOSE 80 90  --  125*  BUN 39* 18  --  27*  CREATININE 11.40* 6.87* 8.74* 9.16*  CALCIUM 8.6 8.5  --  8.9  PHOS 5.1* 3.3  --  4.6    Recent Labs Lab 10/01/14 1620  10/03/14 0549 10/04/14 1130 10/04/14 1823  WBC 5.4  < > 5.2 5.3 4.5  NEUTROABS 3.3  --   --   --   --   HGB 9.8*  < > 10.2* 10.5* 10.5*  HCT 30.2*  < >  32.2* 32.9* 33.2*  MCV 88.8  < > 88.5 90.9 89.5  PLT 154  < > 180 163 155  < > = values in this interval not displayed.

## 2014-10-05 NOTE — Progress Notes (Signed)
Patient ID: Jonathan ApleyHenry S Padilla, male   DOB: 09/12/1927, 81 y.o.   MRN: [id number] Vascular Surgery Progress Note  Subjective: Gangrene right third toe secondary to severe superficial femoral and tibial occlusive disease. Angiograms revealed near total occlusion of superficial femoral artery and terrible tibial occlusive disease from knee to foot with no good tibial vessels for revascularization. Patient is not candidate for revascularization. Denies severe pain in the right third toe.  Objective:  Filed Vitals:   10/05/14 0601  BP: 149/87  Pulse: 88  Temp: 97.8 F (36.6 C)  Resp: 18    Alert and oriented 3 Right foot examined and dry gangrene involves distal half of third toe. No proximal cellulitis or drainage. 2+ popliteal pulse palpable.   Labs:  Recent Labs Lab 10/03/14 0549 10/04/14 1823 10/04/14 2100  CREATININE 6.87* 8.74* 9.16*    Recent Labs Lab 10/02/14 0251 10/03/14 0549 10/04/14 1823 10/04/14 2100  NA 133* 135  --  137  K 3.4* 3.6  --  4.0  CL 93* 97  --  99  CO2 23 25  --  24  BUN 39* 18  --  27*  CREATININE 11.40* 6.87* 8.74* 9.16*  GLUCOSE 80 90  --  125*  CALCIUM 8.6 8.5  --  8.9    Recent Labs Lab 10/03/14 0549 10/04/14 1130 10/04/14 1823  WBC 5.2 5.3 4.5  HGB 10.2* 10.5* 10.5*  HCT 32.2* 32.9* 33.2*  PLT 180 163 155    Recent Labs Lab 10/04/14 1130  INR 1.32    I/O last 3 completed shifts: In: 222 [P.O.:222] Out: 3000 [Other:3000]  Imaging: No results found.  Assessment/Plan:    LOS: 3 days  s/p Procedure(s): LOWER EXTREMITY ANGIOGRAM  Would recommend no treatment at the present time. Third toe amputation would likely not heal and would then lead to right BKA Patient is not having significant pain and there is no evidence of infection at the present time.  Only option would be proceeding with amputation and I do not recommend that at this time since he is able to get along with the dry gangrene of the third toe. This may result  in proximal infection and may require amputation. He is not interested in proceeding with leg amputation at this time. We'll see again at your request   Josephina GipJames Kedra Mcglade, MD 10/05/2014 9:51 AM

## 2014-10-05 NOTE — Discharge Summary (Signed)
Physician Discharge Summary  Rebecka ApleyHenry S Ketcher ZOX:[id number] DOB: 10/02/1927 DOA: 10/01/2014  PCP: Bennie PieriniMARTIN,MARY MARGARET, FNP  Admit date: 10/01/2014 Discharge date: 10/05/2014  Time spent: > 35 minutes  Recommendations for Outpatient Follow-up:  1. Please continue to monitor toe, vascular surgeon evaluated and found no surgery.  Discharge Diagnoses:  Principal Problem:   Gangrene of toe Active Problems:   Essential hypertension   ESRD (end stage renal disease) on dialysis   CAD (coronary artery disease)   Gout   Dyslipidemia   Discharge Condition: stable  Diet recommendation: Renal diet  Filed Weights   10/04/14 1940 10/04/14 2320 10/05/14 0332  Weight: 70.3 kg (154 lb 15.7 oz) 67.1 kg (147 lb 14.9 oz) 65 kg (143 lb 4.8 oz)    History of present illness:  Patient is an 81 year old with history of peripheral vascular disease, end-stage renal disease on hemodialysis, coronary artery disease, hypertension. Who presented with gangrenous third toe of right foot.  Hospital Course:  Gangrene of toe - Consulted vascular surgery further work up and subsequently arteriogram was obtained. Vascular surgeon mention the following: Would recommend no treatment at the present time. Third toe amputation would likely not heal and would then lead to right BKA Patient is not having significant pain and there is no evidence of infection at the present time.  Only option would be proceeding with amputation and I do not recommend that at this time since he is able to get along with the dry gangrene of the third toe. This may result in proximal infection and may require amputation. He is not interested in proceeding with leg amputation at this time. We'll see again at your request  Active Problems:  Essential hypertension - Patient on carvedilol, Imdur   ESRD (end stage renal disease) on dialysis - Nephrology on board and managing   CAD (coronary artery disease) - Stable no chest pain  reported, on aspirin and statin   Gout - Stable   Dyslipidemia - Stable continue statin  Procedures: Angiograms revealed near total occlusion of superficial femoral artery and terrible tibial occlusive disease from knee to foot with no good tibial vessels for revascularization.  Consultations:  Vascular surgeon: Dr. Hart RochesterLawson  Discharge Exam: Filed Vitals:   10/05/14 0601  BP: 149/87  Pulse: 88  Temp: 97.8 F (36.6 C)  Resp: 18    General: Pt in nad, alert and awake Cardiovascular: rrr, no mrg Respiratory: cta bl, no wheezes  Discharge Instructions   Discharge Instructions    Call MD for:  difficulty breathing, headache or visual disturbances    Complete by:  As directed      Call MD for:  severe uncontrolled pain    Complete by:  As directed      Call MD for:  temperature >100.4    Complete by:  As directed      Diet - low sodium heart healthy    Complete by:  As directed      Discharge instructions    Complete by:  As directed   Please have your pcp reevaluate your toe within the next 1 week     Increase activity slowly    Complete by:  As directed           Current Discharge Medication List    CONTINUE these medications which have NOT CHANGED   Details  aspirin 81 MG tablet Take 81 mg by mouth daily.    atorvastatin (LIPITOR) 40 MG tablet Take 40 mg by mouth  daily.    carvedilol (COREG) 6.25 MG tablet TAKE ONE TABLET BY MOUTH TWICE DAILY WITH FOOD Qty: 60 tablet, Refills: 4    isosorbide mononitrate (IMDUR) 30 MG 24 hr tablet TAKE ONE TABLET BY MOUTH ONE TIME DAILY Qty: 30 tablet, Refills: 3    multivitamin (RENA-VIT) TABS tablet Take 1 tablet by mouth daily.    mupirocin ointment (BACTROBAN) 2 % Apply topical to wound on toe TID. Cover with nonstick gauze Qty: 22 g, Refills: 0   Associated Diagnoses: Open wound of toe with complication, initial encounter    pantoprazole (PROTONIX) 40 MG tablet TAKE ONE TABLET BY MOUTH ONE TIME DAILY Qty: 30  tablet, Refills: 4    sevelamer (RENAGEL) 800 MG tablet Take 800 mg by mouth See admin instructions. Take 5 tabs once a day    COLCRYS 0.6 MG tablet Take 1 tablet (0.6 mg total) by mouth daily. Qty: 30 tablet, Refills: 0   Associated Diagnoses: Chronic gout due to renal impairment involving foot without tophus, unspecified laterality    cyclobenzaprine (FLEXERIL) 5 MG tablet Take 1 tablet (5 mg total) by mouth 3 (three) times daily as needed for muscle spasms. Qty: 30 tablet, Refills: 1   Associated Diagnoses: Cramp of both lower extremities       No Known Allergies    The results of significant diagnostics from this hospitalization (including imaging, microbiology, ancillary and laboratory) are listed below for reference.    Significant Diagnostic Studies: Dg Foot Complete Right  Oct 27, 2014   CLINICAL DATA:  Irritation at the right first and third toes. Initial encounter.  EXAM: RIGHT FOOT COMPLETE - 3+ VIEW  COMPARISON:  Right foot radiographs performed 09/04/2014  FINDINGS: There is no evidence of fracture or dislocation. The joint spaces are preserved. There is no evidence of talar subluxation; the subtalar joint is unremarkable in appearance.  Dorsal soft tissue swelling is noted at the forefoot. Diffuse vascular calcifications are seen.  IMPRESSION: No evidence of fracture or dislocation.   Electronically Signed   By: Roanna Raider M.D.   On: 10/27/14 23:34    Microbiology: Recent Results (from the past 240 hour(s))  Aerobic culture     Status: None   Collection Time: 09/28/14  5:06 PM  Result Value Ref Range Status   Aerobic Bacterial Culture Final report  Final   Result 1 Comment  Final    Comment: No growth in 36 - 48 hours.  Culture, blood (routine x 2)     Status: None (Preliminary result)   Collection Time: 10/02/14  2:51 AM  Result Value Ref Range Status   Specimen Description BLOOD BLOOD LEFT FOREARM  Final   Special Requests BOTTLES DRAWN AEROBIC AND ANAEROBIC  5CC EA  Final   Culture   Final           BLOOD CULTURE RECEIVED NO GROWTH TO DATE CULTURE WILL BE HELD FOR 5 DAYS BEFORE ISSUING A FINAL NEGATIVE REPORT Performed at [facility]    Report Status PENDING  Incomplete  Culture, blood (routine x 2)     Status: None (Preliminary result)   Collection Time: 10/02/14  3:00 AM  Result Value Ref Range Status   Specimen Description BLOOD LEFT WRIST  Final   Special Requests BOTTLES DRAWN AEROBIC ONLY 4.5CC  Final   Culture   Final           BLOOD CULTURE RECEIVED NO GROWTH TO DATE CULTURE WILL BE HELD FOR 5 DAYS BEFORE ISSUING  A FINAL NEGATIVE REPORT Performed at [facility]    Report Status PENDING  Incomplete  Surgical pcr screen     Status: Abnormal   Collection Time: 10/02/14  9:07 AM  Result Value Ref Range Status   MRSA, PCR NEGATIVE NEGATIVE Final   Staphylococcus aureus POSITIVE (A) NEGATIVE Final    Comment:        The Xpert SA Assay (FDA approved for NASAL specimens in patients over 4 years of age), is one component of a comprehensive surveillance program.  Test performance has been validated by [facility] for patients greater than or equal to 85 year old. It is not intended to diagnose infection nor to guide or monitor treatment.      Labs: Basic Metabolic Panel:  Recent Labs Lab 10/01/14 1631 10/02/14 0251 10/03/14 0549 10/04/14 1823 10/04/14 2100 10/05/14 1055  NA 134* 133* 135  --  137 139  K 3.4* 3.4* 3.6  --  4.0 4.2  CL 97 93* 97  --  99 102  CO2  --  23 25  --  24 22  GLUCOSE 85 80 90  --  125* 131*  BUN 40* 39* 18  --  27* 21  CREATININE 11.00* 11.40* 6.87* 8.74* 9.16* 8.05*  CALCIUM  --  8.6 8.5  --  8.9 9.0  PHOS  --  5.1* 3.3  --  4.6 4.3   Liver Function Tests:  Recent Labs Lab 10/02/14 0251 10/03/14 0549 10/04/14 2100 10/05/14 1055  AST 34  --   --   --   ALT 13  --   --   --   ALKPHOS 72  --   --   --   BILITOT 0.8  --   --   --   PROT 6.7  --   --   --   ALBUMIN  2.9* 2.9* 2.8* 3.0*   No results for input(s): LIPASE, AMYLASE in the last 168 hours. No results for input(s): AMMONIA in the last 168 hours. CBC:  Recent Labs Lab 10/01/14 1620  10/02/14 0251 10/03/14 0549 10/04/14 1130 10/04/14 1823 10/05/14 1055  WBC 5.4  --  5.8 5.2 5.3 4.5 6.4  NEUTROABS 3.3  --   --   --   --   --   --   HGB 9.8*  < > 9.9* 10.2* 10.5* 10.5* 10.8*  HCT 30.2*  < > 31.1* 32.2* 32.9* 33.2* 34.2*  MCV 88.8  --  87.6 88.5 90.9 89.5 90.7  PLT 154  --  171 180 163 155 148*  < > = values in this interval not displayed. Cardiac Enzymes: No results for input(s): CKTOTAL, CKMB, CKMBINDEX, TROPONINI in the last 168 hours. BNP: BNP (last 3 results) No results for input(s): BNP in the last 8760 hours.  ProBNP (last 3 results) No results for input(s): PROBNP in the last 8760 hours.  CBG: No results for input(s): GLUCAP in the last 168 hours.     Signed:  Penny Pia  [facility] 10/05/2014, 12:38 PM

## 2014-10-05 NOTE — Progress Notes (Signed)
ANTIBIOTIC CONSULT NOTE - Follow Up  Pharmacy Consult for vancomycin Indication: cellulitis  No Known Allergies  Patient Measurements: Height: 6' (182.9 cm) Weight: 143 lb 4.8 oz (65 kg) IBW/kg (Calculated) : 77.6  Vital Signs: Temp: 97.8 F (36.6 C) (04/29 0601) Temp Source: Axillary (04/29 0601) BP: 149/87 mmHg (04/29 0601) Pulse Rate: 88 (04/29 0601)  Labs:  Recent Labs  10/03/14 0549 10/04/14 1130 10/04/14 1823 10/04/14 2100  WBC 5.2 5.3 4.5  --   HGB 10.2* 10.5* 10.5*  --   PLT 180 163 155  --   CREATININE 6.87*  --  8.74* 9.16*   Estimated Creatinine Clearance: 5.4 mL/min (by C-G formula based on Cr of 9.16).   Microbiology: Recent Results (from the past 720 hour(s))  Aerobic culture     Status: None   Collection Time: 09/28/14  5:06 PM  Result Value Ref Range Status   Aerobic Bacterial Culture Final report  Final   Result 1 Comment  Final    Comment: No growth in 36 - 48 hours.  Culture, blood (routine x 2)     Status: None (Preliminary result)   Collection Time: 10/02/14  2:51 AM  Result Value Ref Range Status   Specimen Description BLOOD BLOOD LEFT FOREARM  Final   Special Requests BOTTLES DRAWN AEROBIC AND ANAEROBIC 5CC EA  Final   Culture   Final           BLOOD CULTURE RECEIVED NO GROWTH TO DATE CULTURE WILL BE HELD FOR 5 DAYS BEFORE ISSUING A FINAL NEGATIVE REPORT Performed at [facility]    Report Status PENDING  Incomplete  Culture, blood (routine x 2)     Status: None (Preliminary result)   Collection Time: 10/02/14  3:00 AM  Result Value Ref Range Status   Specimen Description BLOOD LEFT WRIST  Final   Special Requests BOTTLES DRAWN AEROBIC ONLY 4.5CC  Final   Culture   Final           BLOOD CULTURE RECEIVED NO GROWTH TO DATE CULTURE WILL BE HELD FOR 5 DAYS BEFORE ISSUING A FINAL NEGATIVE REPORT Performed at [facility]    Report Status PENDING  Incomplete  Surgical pcr screen     Status: Abnormal   Collection Time:  10/02/14  9:07 AM  Result Value Ref Range Status   MRSA, PCR NEGATIVE NEGATIVE Final   Staphylococcus aureus POSITIVE (A) NEGATIVE Final    Comment:        The Xpert SA Assay (FDA approved for NASAL specimens in patients over 79 years of age), is one component of a comprehensive surveillance program.  Test performance has been validated by [facility] for patients greater than or equal to 79 year old. It is not intended to diagnose infection nor to guide or monitor treatment.     Medical History: Past Medical History  Diagnosis Date  . Hyperlipidemia   . Hypertension   . Congestive heart failure, unspecified   . Coronary atherosclerosis of native coronary artery   . 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 End stage renal disease     on HD MWF; [facility]  . Aortoiliac occlusive disease   . Secondary hyperparathyroidism   . Gout   . MI (myocardial infarction)   . Hemodialysis patient 1999  . Shortness of breath   . Pneumonia 08/09/2012    Medications:  Prescriptions prior to admission  Medication Sig Dispense  Refill Last Dose  . aspirin 81 MG tablet Take 81 mg by mouth daily.   10/01/2014 at Unknown time  . atorvastatin (LIPITOR) 40 MG tablet Take 40 mg by mouth daily.   10/01/2014 at Unknown time  . carvedilol (COREG) 6.25 MG tablet TAKE ONE TABLET BY MOUTH TWICE DAILY WITH FOOD 60 tablet 4 10/01/2014 at Unknown time  . isosorbide mononitrate (IMDUR) 30 MG 24 hr tablet TAKE ONE TABLET BY MOUTH ONE TIME DAILY 30 tablet 3 10/01/2014 at Unknown time  . multivitamin (RENA-VIT) TABS tablet Take 1 tablet by mouth daily.   10/01/2014 at Unknown time  . mupirocin ointment (BACTROBAN) 2 % Apply topical to wound on toe TID. Cover with nonstick gauze (Patient taking differently: Apply 1 application topically See admin instructions. Apply topical to wound on toe TID. Cover with nonstick gauze) 22 g 0 Past Week at Unknown time  . pantoprazole (PROTONIX) 40 MG  tablet TAKE ONE TABLET BY MOUTH ONE TIME DAILY 30 tablet 4 10/01/2014 at Unknown time  . sevelamer (RENAGEL) 800 MG tablet Take 800 mg by mouth See admin instructions. Take 5 tabs once a day   10/01/2014 at Unknown time   Scheduled:  . aspirin EC  81 mg Oral Daily  . atorvastatin  40 mg Oral Daily  . carvedilol  6.25 mg Oral BID WC  . Chlorhexidine Gluconate Cloth  6 each Topical Daily  . darbepoetin (ARANESP) injection - DIALYSIS  60 mcg Intravenous Q Tue-HD  . doxercalciferol  4 mcg Intravenous Q T,Th,Sa-HD  . heparin  5,000 Units Subcutaneous 3 times per day  . isosorbide mononitrate  30 mg Oral QHS  . mupirocin ointment  1 application Nasal BID  . pantoprazole  40 mg Oral Daily  . sodium chloride  3 mL Intravenous Q12H  . vancomycin  750 mg Intravenous Q T,Th,Sa-HD    Assessment: 81yo male c/o injury to right foot 2-3d ago,admitted 10/01/2014   with toe infected w/ drainage, to begin IV ABX for cellulitis; of note pt missed Sat HD.  Infectious Disease-  Vanc-Rx for cellulitis.  WBC wnl, AFeb. Surgery planning aortogram, bilateral leg runoff and possible right leg intervention > s/p 4/29 arteriogram vascular recommends no surgery at this time for dry gangrene that the patient is able to get along with.  4/26 vancomycin  4/26 Blood Cx x2>>   Goal of Therapy:  Pre-HD vanc level 15-25  Plan:  Vancomycin  IV after each HD  Follow up plan for length of therapy withplan for conservative therapy of dry gangrene on foot. Follow up SCr, UOP, cultures, clinical course and adjust as clinically indicated.   Thank you for allowing pharmacy to be a part of this patients care team.  Lovenia Kim Pharm.D., BCPS, AQ-Cardiology Clinical Pharmacist 10/05/2014 11:02 AM Pager: [phone] Phone: [phone]

## 2014-10-05 NOTE — Progress Notes (Signed)
Pt discharge education and instructions completed with pt and son at bedside; all voices understanding and denies any questions. Pt IV and telemetry removed; pt to pick up electronically sent prescription from preferred pharmacy on file. Pt discharge home with son to transport him home. Pt transported off unit via wheelchair with belongings including his cane and son at side. Arabella MerlesP. Amo Aliya Sol RN.

## 2014-10-05 NOTE — Progress Notes (Signed)
Medicare Important Message given? YES  (If response is "NO", the following Medicare IM given date fields will be blank)  Date Medicare IM given: 10/05/14 Medicare IM given by:  Ugochi Henzler  

## 2014-10-05 NOTE — Care Management Note (Signed)
    Page 1 of 1   10/05/2014     2:52:10 PM CARE MANAGEMENT NOTE 10/05/2014  Patient:  Jonathan Padilla,Jonathan Padilla   Account Number:  [id number]  Date Initiated:  10/05/2014  Documentation initiated by:  Donn PieriniWEBSTER,Alaster Asfaw  Subjective/Objective Assessment:   Pt admitted with gangrene of toes     Action/Plan:   PTA pt lived at home  has a homehealth aide   Anticipated DC Date:  10/05/2014   Anticipated DC Plan:  HOME W HOME HEALTH SERVICES      DC Planning Services  CM consult      [facility]   Choice offered to / List presented to:  C-4 Adult Children        HH arranged  HH[facility] PT      [facility]   Status of service:  In process, will continue to follow Medicare Important Message given?  YES (If response is "NO", the following Medicare IM given date fields will be blank) Date Medicare IM given:  10/05/2014 Medicare IM given by:  Donn PieriniWEBSTER,Adian Jablonowski Date Additional Medicare IM given:   Additional Medicare IM given by:    Discharge Disposition:  HOME W HOME HEALTH SERVICES  Per UR Regulation:  Reviewed for med. necessity/level of care/duration of stay  If discussed at Long Length of Stay Meetings, dates discussed:    Comments:  10/05/14- 1300- Donn PieriniKristi Cortavius Montesinos RN, BSN [phone] Pt for d/c home today- per Bedside RN - [facility] MD placed order for [facility]- pt had an aide at home- [facility] not been seen by them since 2014- referral has been accepted- [facility]-

## 2014-10-08 LAB — CULTURE, BLOOD (ROUTINE X 2)
CULTURE: NO GROWTH
Culture: NO GROWTH

## 2014-10-10 ENCOUNTER — Encounter (HOSPITAL_BASED_OUTPATIENT_CLINIC_OR_DEPARTMENT_OTHER): Payer: Medicare Other

## 2014-10-31 ENCOUNTER — Telehealth: Payer: Self-pay | Admitting: Nurse Practitioner

## 2014-10-31 NOTE — Telephone Encounter (Signed)
Appointment given for Tuesday with Jonathan Dawleyiffany Gann.

## 2014-11-06 ENCOUNTER — Encounter: Payer: Self-pay | Admitting: Physician Assistant

## 2014-11-06 ENCOUNTER — Ambulatory Visit (INDEPENDENT_AMBULATORY_CARE_PROVIDER_SITE_OTHER): Payer: Medicare Other | Admitting: Physician Assistant

## 2014-11-06 VITALS — BP 118/66 | HR 103 | Temp 97.3°F | Ht 69.0 in | Wt 156.0 lb

## 2014-11-06 DIAGNOSIS — I96 Gangrene, not elsewhere classified: Secondary | ICD-10-CM

## 2014-11-06 NOTE — Progress Notes (Addendum)
   Subjective:    Patient ID: Jonathan Padilla, male    DOB: 08/23/1927, 81 y.o.   MRN: [id number]  HPI 81 y/o male with CRF, dialysis three times weekly, presents for hospital follow up of necrotic toe on right foot, 3rd toe. He is going to [place] wound center in [place] in two days. He states that he is not having pain. He saw Dr. Ulice Brilliantrake, Podiatrist last week and was told to stop using Mupirocin OIntment, clean with Dial soap BID. Had recent vascular studies, which indicated significant reduced blood flow. Patient was advised by hospital that he would need knee down amputation due to lack of blood supply. Patient declined.     Review of Systems  HENT: Negative.   Respiratory: Positive for shortness of breath (chronic ).   Musculoskeletal: Positive for arthralgias.  Skin:       Feels like his toes are improving but still discolored and decreased sensation. Swelling in BLE  Neurological: Negative for dizziness.  All other systems reviewed and are negative.      Objective:   Physical Exam  Constitutional: He is oriented to person, place, and time. He appears well-developed.  Cardiovascular:  Pulses not detectable on bilateral LE, feet.Right foot, third toe necrotic, extending into base of 2nd and 4th digits. Feet negative for other skin breakdown areas.   Musculoskeletal: He exhibits edema (BLE).  Neurological: He is alert and oriented to person, place, and time.  Nursing note and vitals reviewed.         Assessment & Plan:  1. Necrotic toes - Continue follow up and instructions given by Dr. Ulice Brilliantrake to use dial soap BID - continue treatment at wound clinic  - F/U if signs/symmptoms or indication of infection arise.    Continue all meds Labs pending Health Maintenance reviewed Diet and exercise encouraged RTO prn   Tiffany A. Chauncey ReadingGann PA-C

## 2014-11-08 ENCOUNTER — Encounter (HOSPITAL_BASED_OUTPATIENT_CLINIC_OR_DEPARTMENT_OTHER): Payer: Medicare Other | Attending: Internal Medicine

## 2014-11-08 DIAGNOSIS — I70245 Atherosclerosis of native arteries of left leg with ulceration of other part of foot: Secondary | ICD-10-CM | POA: Insufficient documentation

## 2014-11-08 DIAGNOSIS — I739 Peripheral vascular disease, unspecified: Secondary | ICD-10-CM | POA: Diagnosis not present

## 2014-11-08 DIAGNOSIS — I2510 Atherosclerotic heart disease of native coronary artery without angina pectoris: Secondary | ICD-10-CM | POA: Insufficient documentation

## 2014-11-08 DIAGNOSIS — J45909 Unspecified asthma, uncomplicated: Secondary | ICD-10-CM | POA: Insufficient documentation

## 2014-11-08 DIAGNOSIS — L97511 Non-pressure chronic ulcer of other part of right foot limited to breakdown of skin: Secondary | ICD-10-CM | POA: Diagnosis not present

## 2014-11-08 DIAGNOSIS — L97521 Non-pressure chronic ulcer of other part of left foot limited to breakdown of skin: Secondary | ICD-10-CM | POA: Insufficient documentation

## 2014-11-08 DIAGNOSIS — Z87891 Personal history of nicotine dependence: Secondary | ICD-10-CM | POA: Insufficient documentation

## 2014-11-08 DIAGNOSIS — I252 Old myocardial infarction: Secondary | ICD-10-CM | POA: Insufficient documentation

## 2014-11-08 DIAGNOSIS — I10 Essential (primary) hypertension: Secondary | ICD-10-CM | POA: Insufficient documentation

## 2014-11-08 DIAGNOSIS — N186 End stage renal disease: Secondary | ICD-10-CM | POA: Insufficient documentation

## 2014-11-08 DIAGNOSIS — H269 Unspecified cataract: Secondary | ICD-10-CM | POA: Diagnosis not present

## 2014-11-08 DIAGNOSIS — Z950 Presence of cardiac pacemaker: Secondary | ICD-10-CM | POA: Diagnosis not present

## 2014-11-08 DIAGNOSIS — Z992 Dependence on renal dialysis: Secondary | ICD-10-CM | POA: Diagnosis not present

## 2014-11-08 DIAGNOSIS — I509 Heart failure, unspecified: Secondary | ICD-10-CM | POA: Insufficient documentation

## 2014-11-08 DIAGNOSIS — I96 Gangrene, not elsewhere classified: Secondary | ICD-10-CM | POA: Insufficient documentation

## 2014-11-10 ENCOUNTER — Other Ambulatory Visit: Payer: Self-pay | Admitting: Physician Assistant

## 2014-11-13 ENCOUNTER — Telehealth: Payer: Self-pay | Admitting: Nurse Practitioner

## 2014-11-14 NOTE — Telephone Encounter (Signed)
faxed

## 2014-11-15 DIAGNOSIS — L97511 Non-pressure chronic ulcer of other part of right foot limited to breakdown of skin: Secondary | ICD-10-CM | POA: Diagnosis not present

## 2014-11-15 DIAGNOSIS — I96 Gangrene, not elsewhere classified: Secondary | ICD-10-CM | POA: Diagnosis not present

## 2014-11-15 DIAGNOSIS — I70245 Atherosclerosis of native arteries of left leg with ulceration of other part of foot: Secondary | ICD-10-CM | POA: Diagnosis not present

## 2014-11-15 DIAGNOSIS — L97521 Non-pressure chronic ulcer of other part of left foot limited to breakdown of skin: Secondary | ICD-10-CM | POA: Diagnosis not present

## 2014-11-22 DIAGNOSIS — L97521 Non-pressure chronic ulcer of other part of left foot limited to breakdown of skin: Secondary | ICD-10-CM | POA: Diagnosis not present

## 2014-11-22 DIAGNOSIS — I70245 Atherosclerosis of native arteries of left leg with ulceration of other part of foot: Secondary | ICD-10-CM | POA: Diagnosis not present

## 2014-11-22 DIAGNOSIS — I96 Gangrene, not elsewhere classified: Secondary | ICD-10-CM | POA: Diagnosis not present

## 2014-11-22 DIAGNOSIS — L97511 Non-pressure chronic ulcer of other part of right foot limited to breakdown of skin: Secondary | ICD-10-CM | POA: Diagnosis not present

## 2014-11-26 ENCOUNTER — Other Ambulatory Visit: Payer: Self-pay | Admitting: Family Medicine

## 2014-11-27 ENCOUNTER — Encounter (HOSPITAL_COMMUNITY): Payer: Self-pay | Admitting: Emergency Medicine

## 2014-11-27 ENCOUNTER — Telehealth: Payer: Self-pay

## 2014-11-27 ENCOUNTER — Emergency Department (HOSPITAL_COMMUNITY)
Admission: EM | Admit: 2014-11-27 | Discharge: 2014-11-27 | Disposition: A | Discharge status: Home / Self Care | Payer: Medicare Other | Attending: Emergency Medicine | Admitting: Emergency Medicine

## 2014-11-27 DIAGNOSIS — Z7982 Long term (current) use of aspirin: Secondary | ICD-10-CM | POA: Diagnosis not present

## 2014-11-27 DIAGNOSIS — N185 Chronic kidney disease, stage 5: Secondary | ICD-10-CM

## 2014-11-27 DIAGNOSIS — N186 End stage renal disease: Secondary | ICD-10-CM | POA: Diagnosis not present

## 2014-11-27 DIAGNOSIS — Z951 Presence of aortocoronary bypass graft: Secondary | ICD-10-CM | POA: Insufficient documentation

## 2014-11-27 DIAGNOSIS — M79671 Pain in right foot: Secondary | ICD-10-CM | POA: Diagnosis present

## 2014-11-27 DIAGNOSIS — I509 Heart failure, unspecified: Secondary | ICD-10-CM | POA: Insufficient documentation

## 2014-11-27 DIAGNOSIS — I120 Hypertensive chronic kidney disease with stage 5 chronic kidney disease or end stage renal disease: Secondary | ICD-10-CM | POA: Insufficient documentation

## 2014-11-27 DIAGNOSIS — E876 Hypokalemia: Secondary | ICD-10-CM

## 2014-11-27 DIAGNOSIS — I96 Gangrene, not elsewhere classified: Secondary | ICD-10-CM | POA: Diagnosis not present

## 2014-11-27 DIAGNOSIS — Z8701 Personal history of pneumonia (recurrent): Secondary | ICD-10-CM | POA: Insufficient documentation

## 2014-11-27 DIAGNOSIS — Z79899 Other long term (current) drug therapy: Secondary | ICD-10-CM | POA: Insufficient documentation

## 2014-11-27 DIAGNOSIS — I2510 Atherosclerotic heart disease of native coronary artery without angina pectoris: Secondary | ICD-10-CM | POA: Insufficient documentation

## 2014-11-27 DIAGNOSIS — Z87891 Personal history of nicotine dependence: Secondary | ICD-10-CM | POA: Diagnosis not present

## 2014-11-27 DIAGNOSIS — Z992 Dependence on renal dialysis: Secondary | ICD-10-CM | POA: Diagnosis not present

## 2014-11-27 DIAGNOSIS — I252 Old myocardial infarction: Secondary | ICD-10-CM | POA: Diagnosis not present

## 2014-11-27 LAB — BASIC METABOLIC PANEL
Anion gap: 11 (ref 5–15)
BUN: 21 mg/dL — AB (ref 6–20)
CO2: 29 mmol/L (ref 22–32)
Calcium: 8.5 mg/dL — ABNORMAL LOW (ref 8.9–10.3)
Chloride: 97 mmol/L — ABNORMAL LOW (ref 101–111)
Creatinine, Ser: 4.5 mg/dL — ABNORMAL HIGH (ref 0.61–1.24)
GFR calc Af Amer: 12 mL/min — ABNORMAL LOW (ref >60)
GFR, EST NON AFRICAN AMERICAN: 11 mL/min — AB (ref >60)
GLUCOSE: 100 mg/dL — AB (ref 65–99)
POTASSIUM: 2.9 mmol/L — AB (ref 3.5–5.1)
Sodium: 137 mmol/L (ref 135–145)

## 2014-11-27 LAB — CBC WITH DIFFERENTIAL/PLATELET
BASOS ABS: 0 10*3/uL (ref 0.0–0.1)
Basophils Relative: 0 % (ref 0–1)
EOS PCT: 2 % (ref 0–5)
Eosinophils Absolute: 0.2 10*3/uL (ref 0.0–0.7)
HCT: 31.7 % — ABNORMAL LOW (ref 39.0–52.0)
Hemoglobin: 9.8 g/dL — ABNORMAL LOW (ref 13.0–17.0)
LYMPHS ABS: 0.6 10*3/uL — AB (ref 0.7–4.0)
LYMPHS PCT: 7 % — AB (ref 12–46)
MCH: 27.3 pg (ref 26.0–34.0)
MCHC: 30.9 g/dL (ref 30.0–36.0)
MCV: 88.3 fL (ref 78.0–100.0)
Monocytes Absolute: 1.8 10*3/uL — ABNORMAL HIGH (ref 0.1–1.0)
Monocytes Relative: 20 % — ABNORMAL HIGH (ref 3–12)
NEUTROS ABS: 6.5 10*3/uL (ref 1.7–7.7)
Neutrophils Relative %: 71 % (ref 43–77)
PLATELETS: 176 10*3/uL (ref 150–400)
RBC: 3.59 MIL/uL — ABNORMAL LOW (ref 4.22–5.81)
RDW: 15.9 % — ABNORMAL HIGH (ref 11.5–15.5)
WBC: 9.1 10*3/uL (ref 4.0–10.5)

## 2014-11-27 MED ORDER — BACITRACIN ZINC 500 UNIT/GM EX OINT
TOPICAL_OINTMENT | Freq: Once | CUTANEOUS | Status: AC
  Administered 2014-11-27: 1 via TOPICAL
  Filled 2014-11-27: qty 0.9

## 2014-11-27 MED ORDER — BACITRACIN ZINC 500 UNIT/GM EX OINT: 1.0000 "application " | TOPICAL_OINTMENT | Freq: Once | CUTANEOUS | Status: DC

## 2014-11-27 NOTE — Telephone Encounter (Signed)
Patient has fallen a couple of times within the last couple of days  Jonathan Padilla going out to assess abrasion on knee today

## 2014-11-27 NOTE — Progress Notes (Signed)
EDCM spoke to patient and his family at bedside.  Patient lives with his daughter [place].  [place] reports when she is working she has someone stay with him and during the day, " a lady with [employer] stays  with him."  Patient's daughter reports patient's pcp is now [employer] [facility] [place] in [place].  System updated. Patient has a walker at home.  Patient's daughter reports a wheelchair has been ordered through [facility] however she has not received it yet.  EDCM received text from Leucritia dme specialist of [facility] who reports order for wheelchair is pending due to no clinical information sent with the order.  Patient's daughter is also requesting a bedside commode.  Patient's daughter reports patient receives dialysis at [place], [place], and Sat.  Patient is receiving home health services with [facility] for visiting RN for dressing changes to foot.  Patient's daughter reports patient requires assistance with ADL's and meals.  EDCM provided patient's daughter with list of home health agencies in [place] highlighting [facility].   If patient is discharged from the ED this evening, patient's daughter agreeable to increasing home health services, would add PT, OT and aide.  Also place order for bedside commode and wheelchair.  Patient also qualifies for [facility].  Patient's daughter thankful for assistance.  No further EDCM needs at this time.

## 2014-11-27 NOTE — ED Notes (Signed)
Bed: QP61 Expected date: 11/27/14 Expected time:  Means of arrival:  Comments: EMS [place] necrotic toes

## 2014-11-27 NOTE — Telephone Encounter (Signed)
When Home Health nurse went to Jonathan Padilla house to address abrasion to knee he had an altered mental status so she called EMS and they took him to [facility]

## 2014-11-27 NOTE — Care Management Note (Addendum)
Case Management Note  Patient Details  Name: Jonathan Padilla MRN: [id number] Date of Birth: 02-Jun-1928  Subjective/Objective:  Patient presents to ED with weakness, gangrenous toes (chronic).                  Action/Plan:Discussed home health services with patient and family   Expected Discharge Date:   11/27/2014               Expected Discharge Plan:  Home w Home Health Services  In-House Referral:     Discharge planning Services     Post Acute Care Choice:  Durable Medical Equipment Choice offered to:  Adult Children  DME Arranged:  Bedside commode, Wheelchair manual DME Agency:  [facility]  HH Arranged:  PT, OT, Nurse's Aide HH Agency:  [facility]  Status of Service:  Completed, signed off  Medicare Important Message Given:    Date Medicare IM Given:    Medicare IM give by:    Date Additional Medicare IM Given:    Additional Medicare Important Message give by:     If discussed at Long Length of Stay Meetings, dates discussed:    Additional Comments:EDCM spoke to patient and his family at bedside. Patient lives with his daughter Jonathan Padilla. Jonathan Padilla reports when she is working she has someone stay with him and during the day, " a lady with [employer] stays with him." Patient's daughter reports patient's pcp is now [employer] in [place]. System updated. Patient has a walker at home. Patient's daughter reports a wheelchair has been ordered through [facility] however she has not received it yet. EDCM received text from Leucritia dme specialist of [facility] who reports order for wheelchair is pending due to no clinical information sent with the order. Patient's daughter is also requesting a bedside commode. Patient's daughter reports patient receives dialysis at [facility], [place], and [facility]. Patient is receiving home health services with [facility] for visiting RN for dressing changes to foot. Patient's daughter reports patient requires  assistance with ADL's and meals. EDCM provided patient's daughter with list of home health agencies in [place] highlighting [facility]. If patient is discharged from the ED this evening, patient's daughter agreeable to increasing home health services, would add PT, OT and aide. Also place order for bedside commode and wheelchair. Patient also qualifies for [facility]. Patient's daughter thankful for assistance. No further EDCM needs at this time.  [facility] referral placed.  Jonathan Padilla, Jonathan Golson, RN 11/27/2014, 5:45 PM

## 2014-11-27 NOTE — ED Notes (Addendum)
Patient here from home with c/o of toe necrosis. No history of diabetes. Has been seen multiple times for same, refuses surgery. Getting worse pt unable to bear weight.

## 2014-11-27 NOTE — ED Provider Notes (Signed)
CSN: [id number]     Arrival date & time 11/27/14  1456 History   First MD Initiated Contact with Patient 11/27/14 1530      Chief complaint: Lethargy HPI The patient has a history of peripheral vascular disease as well as end-stage renal failure on dialysis and discontinue other medical problems.  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 Patient was told in the past that he is a candidate for amputation. Patient has decided that he does not want that performed. The symptoms have been ongoing for several months. Patient has home health care. He was sent to the emergency room today because his home caregivers were concerned that the patient was more somnolent and less active than usual. They were afraid that the infection in his feet were progressing.   The patient did have dialysis today. His family states that sometimes this occurs after dialysis. Patient himself denies any complaints. He says the only reason he is here is that the ladies that care for him at home or fussing at him. He denies any trouble with pain in his feet. He denies any trouble with any headache or chest pain. No shortness of breath. Fevers vomiting or diarrhea. Past Medical History  Diagnosis Date  . Hyperlipidemia   . Hypertension   . Congestive heart failure, unspecified   . Coronary atherosclerosis of native coronary artery   . 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 End stage renal disease     on HD MWF; [facility]  . Aortoiliac occlusive disease   . Secondary hyperparathyroidism   . Gout   . MI (myocardial infarction)   . Hemodialysis patient 1999  . Shortness of breath   . Pneumonia 08/09/2012   Past Surgical History  Procedure Laterality Date  . Coronary artery bypass graft  04/18/05    Rachell Cipro  . Ppm  11/18/05    Medtronic EnRhythm P1501-complete heart block  . Dialysis fistula creation    . Pacemaker generator change  08/05/12    MDT  Adaptal L generator placd by Dr Johney Frame  . Permanent pacemaker generator change N/A 08/05/2012    Procedure: PERMANENT PACEMAKER GENERATOR CHANGE;  Surgeon: Hillis Range, MD;  Location: [facility] CATH LAB;  Service: Cardiovascular;  Laterality: N/A;  . Lower extremity angiogram N/A 10/04/2014    Procedure: LOWER EXTREMITY ANGIOGRAM;  Surgeon: Fransisco Hertz, MD;  Location: [facility] CATH LAB;  Service: Cardiovascular;  Laterality: N/A;   History reviewed. No pertinent family history. History  Substance Use Topics  . Smoking status: Former Smoker -- 1.00 packs/day for 50 years    Types: Cigarettes    Quit date: 06/09/1995  . Smokeless tobacco: Never Used  . Alcohol Use: No    Review of Systems  All other systems reviewed and are negative.     Allergies  Review of patient's allergies indicates no known allergies.  Home Medications   Prior to Admission medications   Medication Sig Start Date End Date Taking? Authorizing Provider  aspirin 81 MG tablet Take 81 mg by mouth daily.    Historical Provider, MD  atorvastatin (LIPITOR) 40 MG tablet Take 40 mg by mouth daily.    Historical Provider, MD  carvedilol (COREG) 6.25 MG tablet TAKE ONE TABLET BY MOUTH TWICE DAILY WITH FOOD 06/12/14   Ernestina Penna, MD  COLCRYS 0.6 MG tablet TAKE ONE TABLET BY MOUTH EVERY DAY AS DIRECTED  11/15/14   Tiffany A Gann, PA-C  cyclobenzaprine (FLEXERIL) 5 MG tablet Take 1 tablet (5 mg total) by mouth 3 (three) times daily as needed for muscle spasms. 10/02/14   Tiffany A Gann, PA-C  isosorbide mononitrate (IMDUR) 30 MG 24 hr tablet TAKE ONE TABLET BY MOUTH ONE TIME DAILY 07/06/14   Mary-Margaret Daphine Deutscher, FNP  multivitamin (RENA-VIT) TABS tablet Take 1 tablet by mouth daily.    Historical Provider, MD  mupirocin ointment (BACTROBAN) 2 % Apply topical to wound on toe TID. Cover with nonstick gauze Patient taking differently: Apply 1 application topically See admin instructions. Apply topical to wound on toe TID. Cover with nonstick  gauze 09/28/14   Tiffany A Gann, PA-C  pantoprazole (PROTONIX) 40 MG tablet TAKE ONE TABLET BY MOUTH ONE TIME DAILY 11/27/14   Tiffany A Gann, PA-C  sevelamer (RENAGEL) 800 MG tablet Take 800 mg by mouth See admin instructions. Take 5 tabs once a day    Historical Provider, MD   BP 111/64 mmHg  Pulse 67  Temp(Src) 97.4 F (36.3 C) (Oral)  Resp 16  SpO2 99% Physical Exam  Constitutional: No distress.  HENT:  Head: Normocephalic and atraumatic.  Right Ear: External ear normal.  Left Ear: External ear normal.  Eyes: Conjunctivae are normal. Right eye exhibits no discharge. Left eye exhibits no discharge. No scleral icterus.  Neck: Neck supple. No tracheal deviation present.  Cardiovascular: Normal rate, regular rhythm and intact distal pulses.   Pulmonary/Chest: Effort normal and breath sounds normal. No stridor. No respiratory distress. He has no wheezes. He has no rales.  Abdominal: Soft. Bowel sounds are normal. He exhibits no distension. There is no tenderness. There is no rebound and no guarding.  Musculoskeletal: He exhibits edema. He exhibits no tenderness.  Edema of the right foot is greater than the left, the family states that this is chronic; palpate pulses bilateral dorsalis pedis, dry gangrenous areas in the toes of bilateral feet, no erythema, no tenderness  Neurological: He is alert. He displays no atrophy. No cranial nerve deficit (no facial droop, extraocular movements intact, no slurred speech) or sensory deficit. He exhibits normal muscle tone. He displays no seizure activity.  Follows commands, answers all questions  Skin: Skin is warm and dry. No rash noted. He is not diaphoretic.  Psychiatric: He has a normal mood and affect.  Nursing note and vitals reviewed.   ED Course  Procedures (including critical care time) Labs Review Labs Reviewed  CBC WITH DIFFERENTIAL/PLATELET - Abnormal; Notable for the following:    RBC 3.59 (*)    Hemoglobin 9.8 (*)    HCT 31.7 (*)     RDW 15.9 (*)    Lymphocytes Relative 7 (*)    Lymphs Abs 0.6 (*)    Monocytes Relative 20 (*)    Monocytes Absolute 1.8 (*)    All other components within normal limits  BASIC METABOLIC PANEL - Abnormal; Notable for the following:    Potassium 2.9 (*)    Chloride 97 (*)    Glucose, Bld 100 (*)    BUN 21 (*)    Creatinine, Ser 4.50 (*)    Calcium 8.5 (*)    GFR calc non Af Amer 11 (*)    GFR calc Af Amer 12 (*)    All other components within normal limits    Imaging Review No results found.   EKG Interpretation   Date/Time:  Tuesday November 27 2014 16:13:33 EDT Ventricular Rate:  76 PR  Interval:  238 QRS Duration: 191 QT Interval:  534 QTC Calculation: 600 R Axis:   -114 Text Interpretation:  Sinus or ectopic atrial rhythm,  ?electronic atrial  pacemaker Prolonged PR interval RBBB and LAFB Confirmed by Kaidon Kinker  MD-J,  Jessabelle Markiewicz ([id number]) on 11/27/2014 4:18:48 PM      MDM   Final diagnoses:  Chronic renal failure, stage 5  Gangrene of toe  Hypokalemia    The patient has persistent findings associated with his peripheral vascular disease and dry gangrene of his toes. Patient still confirms that he would not like to have any surgery. Patient does not appear to be having any trouble with an active infection. He is afebrile. He does not have a significant leukocytosis.   Patient is hypokalemic however he just had dialysis today. His potassium will elevate on its  Own.  Pt would like to go home.  He denies any complaints.    Linwood Dibbles, MD 11/27/14 [phone]

## 2014-11-27 NOTE — Discharge Instructions (Signed)
Chronic Kidney Disease °Chronic kidney disease occurs when the kidneys are damaged over a long period. The kidneys are two organs that lie on either side of the spine between the middle of the back and the front of the abdomen. The kidneys:  °· Remove wastes and extra water from the blood.   °· Produce important hormones. These help keep bones strong, regulate blood pressure, and help create red blood cells.   °· Balance the fluids and chemicals in the blood and tissues. °A small amount of kidney damage may not cause problems, but a large amount of damage may make it difficult or impossible for the kidneys to work the way they should. If steps are not taken to slow down the kidney damage or stop it from getting worse, the kidneys may stop working permanently. Most of the time, chronic kidney disease does not go away. However, it can often be controlled, and those with the disease can usually live normal lives. °CAUSES  °The most common causes of chronic kidney disease are diabetes and high blood pressure (hypertension). Chronic kidney disease may also be caused by:  °· Diseases that cause the kidneys' filters to become inflamed.   °· Diseases that affect the immune system.   °· Genetic diseases.   °· Medicines that damage the kidneys, such as anti-inflammatory medicines.   °· Poisoning or exposure to toxic substances.   °· A reoccurring kidney or urinary infection.   °· A problem with urine flow. This may be caused by:   °¨ Cancer.   °¨ Kidney stones.   °¨ An enlarged prostate in males. °SIGNS AND SYMPTOMS  °Because the kidney damage in chronic kidney disease occurs slowly, symptoms develop slowly and may not be obvious until the kidney damage becomes severe. A person may have a kidney disease for years without showing any symptoms. Symptoms can include:  °· Swelling (edema) of the legs, ankles, or feet.   °· Tiredness (lethargy).   °· Nausea or vomiting.   °· Confusion.   °· Problems with urination, such as:    °¨ Decreased urine production.   °¨ Frequent urination, especially at night.   °¨ Frequent accidents in children who are potty trained.   °· Muscle twitches and cramps.   °· Shortness of breath.  °· Weakness.   °· Persistent itchiness.   °· Loss of appetite. °· Metallic taste in the mouth. °· Trouble sleeping. °· Slowed development in children. °· Short stature in children. °DIAGNOSIS  °Chronic kidney disease may be detected and diagnosed by tests, including blood, urine, imaging, or kidney biopsy tests.  °TREATMENT  °Most chronic kidney diseases cannot be cured. Treatment usually involves relieving symptoms and preventing or slowing the progression of the disease. Treatment may include:  °· A special diet. You may need to avoid alcohol and foods that are salty and high in potassium.   °· Medicines. These may:   °¨ Lower blood pressure.   °¨ Relieve anemia.   °¨ Relieve swelling.   °¨ Protect the bones. °HOME CARE INSTRUCTIONS  °· Follow your prescribed diet.   °· Take medicines only as directed by your health care provider. Do not take any new medicines (prescription, over-the-counter, or nutritional supplements) unless approved by your health care provider. Many medicines can worsen your kidney damage or need to have the dose adjusted.   °· Quit smoking if you smoke. Talk to your health care provider about a smoking cessation program.   °· Keep all follow-up visits as directed by your health care provider. °SEEK IMMEDIATE MEDICAL CARE IF: °· Your symptoms get worse or you develop new symptoms.   °· You develop symptoms of end-stage kidney disease. These   include:   °¨ Headaches.   °¨ Abnormally dark or light skin.   °¨ Numbness in the hands or feet.   °¨ Easy bruising.   °¨ Frequent hiccups.   °¨ Menstruation stops.   °· You have a fever.   °· You have decreased urine production.   °· You have pain or bleeding when urinating. °MAKE SURE YOU: °· Understand these instructions. °· Will watch your condition. °· Will  get help right away if you are not doing well or get worse. °FOR MORE INFORMATION  °· American Association of Kidney Patients: www.aakp.org °· National Kidney Foundation: www.kidney.org °· American Kidney Fund: www.akfinc.org °· Life [employer] Rehabilitation Program: www.lifeoptions.org and www.kidneyschool.org °Document Released: 03/03/2008 Document Revised: 10/09/2013 Document Reviewed: 01/22/2012 °ExitCare® Patient Information ©2015 [employer]. This information is not intended to replace advice given to you by your health care provider. Make sure you discuss any questions you have with your health care provider. ° °

## 2014-11-30 DIAGNOSIS — L97511 Non-pressure chronic ulcer of other part of right foot limited to breakdown of skin: Secondary | ICD-10-CM | POA: Diagnosis not present

## 2014-11-30 DIAGNOSIS — I96 Gangrene, not elsewhere classified: Secondary | ICD-10-CM | POA: Diagnosis not present

## 2014-11-30 DIAGNOSIS — I70245 Atherosclerosis of native arteries of left leg with ulceration of other part of foot: Secondary | ICD-10-CM | POA: Diagnosis not present

## 2014-11-30 DIAGNOSIS — L97521 Non-pressure chronic ulcer of other part of left foot limited to breakdown of skin: Secondary | ICD-10-CM | POA: Diagnosis not present

## 2014-12-04 ENCOUNTER — Telehealth: Payer: Self-pay

## 2014-12-04 NOTE — Telephone Encounter (Signed)
He needs to go to the ED Arielle Eber A. Chauncey ReadingGann PA-C

## 2014-12-04 NOTE — Telephone Encounter (Signed)
Patient has edema from knees down  Fluid coming out necrotic places on toes  Called wound clinic not candidate for compression  Patient on dialysis so don't know if he could take diuretic??

## 2014-12-04 NOTE — Telephone Encounter (Signed)
Patients nurse aware.

## 2014-12-05 ENCOUNTER — Encounter (HOSPITAL_COMMUNITY): Payer: Self-pay | Admitting: *Deleted

## 2014-12-05 ENCOUNTER — Emergency Department (HOSPITAL_COMMUNITY): Payer: Medicare Other

## 2014-12-05 ENCOUNTER — Emergency Department (HOSPITAL_COMMUNITY)
Admission: EM | Admit: 2014-12-05 | Discharge: 2014-12-05 | Disposition: A | Discharge status: Home / Self Care | Payer: Medicare Other | Attending: Emergency Medicine | Admitting: Emergency Medicine

## 2014-12-05 DIAGNOSIS — I120 Hypertensive chronic kidney disease with stage 5 chronic kidney disease or end stage renal disease: Secondary | ICD-10-CM | POA: Insufficient documentation

## 2014-12-05 DIAGNOSIS — I96 Gangrene, not elsewhere classified: Secondary | ICD-10-CM | POA: Diagnosis not present

## 2014-12-05 DIAGNOSIS — Z992 Dependence on renal dialysis: Secondary | ICD-10-CM | POA: Diagnosis not present

## 2014-12-05 DIAGNOSIS — Z7982 Long term (current) use of aspirin: Secondary | ICD-10-CM | POA: Diagnosis not present

## 2014-12-05 DIAGNOSIS — I252 Old myocardial infarction: Secondary | ICD-10-CM | POA: Insufficient documentation

## 2014-12-05 DIAGNOSIS — Z8739 Personal history of other diseases of the musculoskeletal system and connective tissue: Secondary | ICD-10-CM | POA: Insufficient documentation

## 2014-12-05 DIAGNOSIS — Z8701 Personal history of pneumonia (recurrent): Secondary | ICD-10-CM | POA: Insufficient documentation

## 2014-12-05 DIAGNOSIS — Z87891 Personal history of nicotine dependence: Secondary | ICD-10-CM | POA: Insufficient documentation

## 2014-12-05 DIAGNOSIS — I2510 Atherosclerotic heart disease of native coronary artery without angina pectoris: Secondary | ICD-10-CM | POA: Insufficient documentation

## 2014-12-05 DIAGNOSIS — N186 End stage renal disease: Secondary | ICD-10-CM | POA: Diagnosis not present

## 2014-12-05 DIAGNOSIS — I509 Heart failure, unspecified: Secondary | ICD-10-CM | POA: Insufficient documentation

## 2014-12-05 DIAGNOSIS — E785 Hyperlipidemia, unspecified: Secondary | ICD-10-CM | POA: Diagnosis not present

## 2014-12-05 DIAGNOSIS — N189 Chronic kidney disease, unspecified: Secondary | ICD-10-CM

## 2014-12-05 DIAGNOSIS — R5383 Other fatigue: Secondary | ICD-10-CM | POA: Insufficient documentation

## 2014-12-05 DIAGNOSIS — Z951 Presence of aortocoronary bypass graft: Secondary | ICD-10-CM | POA: Insufficient documentation

## 2014-12-05 DIAGNOSIS — Z79899 Other long term (current) drug therapy: Secondary | ICD-10-CM | POA: Diagnosis not present

## 2014-12-05 DIAGNOSIS — R531 Weakness: Secondary | ICD-10-CM | POA: Insufficient documentation

## 2014-12-05 LAB — CBC WITH DIFFERENTIAL/PLATELET
Basophils Absolute: 0 10*3/uL (ref 0.0–0.1)
Basophils Relative: 0 % (ref 0–1)
Eosinophils Absolute: 0.1 10*3/uL (ref 0.0–0.7)
Eosinophils Relative: 0 % (ref 0–5)
HEMATOCRIT: 32.4 % — AB (ref 39.0–52.0)
Hemoglobin: 10 g/dL — ABNORMAL LOW (ref 13.0–17.0)
LYMPHS ABS: 0.7 10*3/uL (ref 0.7–4.0)
LYMPHS PCT: 4 % — AB (ref 12–46)
MCH: 26.9 pg (ref 26.0–34.0)
MCHC: 30.9 g/dL (ref 30.0–36.0)
MCV: 87.1 fL (ref 78.0–100.0)
MONO ABS: 1.4 10*3/uL — AB (ref 0.1–1.0)
Monocytes Relative: 9 % (ref 3–12)
Neutro Abs: 14.2 10*3/uL — ABNORMAL HIGH (ref 1.7–7.7)
Neutrophils Relative %: 87 % — ABNORMAL HIGH (ref 43–77)
PLATELETS: 113 10*3/uL — AB (ref 150–400)
RBC: 3.72 MIL/uL — ABNORMAL LOW (ref 4.22–5.81)
RDW: 16.2 % — AB (ref 11.5–15.5)
WBC: 16.4 10*3/uL — AB (ref 4.0–10.5)

## 2014-12-05 LAB — BASIC METABOLIC PANEL
ANION GAP: 15 (ref 5–15)
BUN: 26 mg/dL — ABNORMAL HIGH (ref 6–20)
CALCIUM: 8.9 mg/dL (ref 8.9–10.3)
CHLORIDE: 94 mmol/L — AB (ref 101–111)
CO2: 28 mmol/L (ref 22–32)
Creatinine, Ser: 5.52 mg/dL — ABNORMAL HIGH (ref 0.61–1.24)
GFR, EST AFRICAN AMERICAN: 10 mL/min — AB (ref >60)
GFR, EST NON AFRICAN AMERICAN: 8 mL/min — AB (ref >60)
GLUCOSE: 103 mg/dL — AB (ref 65–99)
Potassium: 3.7 mmol/L (ref 3.5–5.1)
Sodium: 137 mmol/L (ref 135–145)

## 2014-12-05 NOTE — Telephone Encounter (Signed)
Done by Azalee CourseAshley Fulp.

## 2014-12-05 NOTE — Discharge Instructions (Signed)

## 2014-12-05 NOTE — ED Provider Notes (Signed)
CSN: [id number]     Arrival date & time 12/05/14  1606 History   First MD Initiated Contact with Patient 12/05/14 1616     Chief Complaint  Patient presents with  . Fatigue    Patient is a 81 y.o. male presenting with weakness. The history is provided by the patient and a relative (daughter at bedside).  Weakness This is a new problem. The current episode started yesterday. The problem occurs constantly. The problem has been gradually improving. Pertinent negatives include no chest pain, no abdominal pain, no headaches and no shortness of breath. Nothing aggravates the symptoms. Nothing relieves the symptoms.  Patient presents from home for fatigue He had dialysis yesterday and since that time he has been fatigued and feeling weak He denies cp/sob/HA/abd pain Daughter reports home health nurse noted his BP was 90s and he was taking decreased PO.  They were concerned for dehydration  Dialysis T/TH/S He does not make urine He denies any other complaints  He has h/o gangrene to feet and has declined surgery in past Daughter reports he will likely be on hospice soon  Past Medical History  Diagnosis Date  . Hyperlipidemia   . Hypertension   . Congestive heart failure, unspecified   . Coronary atherosclerosis of native coronary artery   . 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 End stage renal disease     on HD MWF; [facility]  . Aortoiliac occlusive disease   . Secondary hyperparathyroidism   . Gout   . MI (myocardial infarction)   . Hemodialysis patient 1999  . Shortness of breath   . Pneumonia 08/09/2012   Past Surgical History  Procedure Laterality Date  . Coronary artery bypass graft  04/18/05    Rachell Cipro  . Ppm  11/18/05    Medtronic EnRhythm P1501-complete heart block  . Dialysis fistula creation    . Pacemaker generator change  08/05/12    MDT Adaptal L generator placd by Dr Johney Frame  . Permanent pacemaker generator change N/A 08/05/2012     Procedure: PERMANENT PACEMAKER GENERATOR CHANGE;  Surgeon: Hillis Range, MD;  Location: [facility] CATH LAB;  Service: Cardiovascular;  Laterality: N/A;  . Lower extremity angiogram N/A 10/04/2014    Procedure: LOWER EXTREMITY ANGIOGRAM;  Surgeon: Fransisco Hertz, MD;  Location: [facility] CATH LAB;  Service: Cardiovascular;  Laterality: N/A;   No family history on file. History  Substance Use Topics  . Smoking status: Former Smoker -- 1.00 packs/day for 50 years    Types: Cigarettes    Quit date: 06/09/1995  . Smokeless tobacco: Never Used  . Alcohol Use: No    Review of Systems  Constitutional: Positive for fatigue. Negative for fever.  Respiratory: Negative for shortness of breath.   Cardiovascular: Negative for chest pain.  Gastrointestinal: Negative for abdominal pain.  Neurological: Positive for weakness. Negative for headaches.  All other systems reviewed and are negative.     Allergies  Review of patient's allergies indicates no known allergies.  Home Medications   Prior to Admission medications   Medication Sig Start Date End Date Taking? Authorizing Provider  aspirin 81 MG tablet Take 81 mg by mouth daily.    Historical Provider, MD  atorvastatin (LIPITOR) 40 MG tablet Take 40 mg by mouth daily.    Historical Provider, MD  carvedilol (COREG) 6.25 MG tablet TAKE ONE TABLET BY MOUTH TWICE DAILY WITH FOOD 06/12/14   Ernestina Penna, MD  COLCRYS 0.6 MG tablet TAKE ONE TABLET BY MOUTH EVERY DAY AS DIRECTED 11/15/14   Tiffany A Gann, PA-C  cyclobenzaprine (FLEXERIL) 5 MG tablet Take 1 tablet (5 mg total) by mouth 3 (three) times daily as needed for muscle spasms. 10/02/14   Tiffany A Gann, PA-C  isosorbide mononitrate (IMDUR) 30 MG 24 hr tablet TAKE ONE TABLET BY MOUTH ONE TIME DAILY 07/06/14   Mary-Margaret Daphine DeutscherMartin, FNP  multivitamin (RENA-VIT) TABS tablet Take 1 tablet by mouth daily.    Historical Provider, MD  mupirocin ointment (BACTROBAN) 2 % Apply topical to wound on toe TID. Cover with  nonstick gauze Patient taking differently: Apply 1 application topically See admin instructions. Apply topical to wound on toe TID. Cover with nonstick gauze 09/28/14   Tiffany A Gann, PA-C  pantoprazole (PROTONIX) 40 MG tablet TAKE ONE TABLET BY MOUTH ONE TIME DAILY 11/27/14   Tiffany A Gann, PA-C  sevelamer (RENAGEL) 800 MG tablet Take 800 mg by mouth See admin instructions. Take 5 tabs once a day    Historical Provider, MD   BP 120/59 mmHg  Pulse 125  Resp 19  SpO2 91% Physical Exam CONSTITUTIONAL: elderly and frail HEAD: Normocephalic/atraumatic EYES: EOMI ENMT: Mucous membranes moist NECK: supple no meningeal signs SPINE/BACK:entire spine nontender CV: S1/S2 noted, no murmurs/rubs/gallops noted LUNGS: Lungs are clear to auscultation bilaterally, no apparent distress ABDOMEN: soft, nontender, no rebound or guarding, bowel sounds noted throughout abdomen NEURO: Pt is awake/alert/appropriate, moves all extremitiesx4.   EXTREMITIES: both feet are wrapped in bandages SKIN: warm, color normal PSYCH: no abnormalities of mood noted, alert and oriented to situation  ED Course  Procedures  5:02 PM D/w medtronic rep No evidence of any abnormal rhythms though it must exceed 180 to be recorded on pacemaker Pt currently stable at this time 6:01 PM D/w dr harding He reviewed EKG Does not feel this represents vtach Pt stable at this time 6:45 PM Repeat SBP>100 Pulse ox 94%  Pt would like to be discharged Family is comfortable with this plan He is in no distress He continues to refuse surgery for gangrene Labs at baseline Vitals stable (SBP>100 on my re-assessment)  At this point pt awake/alert, he appears stable for d/c home   Labs Review Labs Reviewed  BASIC METABOLIC PANEL - Abnormal; Notable for the following:    Chloride 94 (*)    Glucose, Bld 103 (*)    BUN 26 (*)    Creatinine, Ser 5.52 (*)    GFR calc non Af Amer 8 (*)    GFR calc Af Amer 10 (*)    All other  components within normal limits  CBC WITH DIFFERENTIAL/PLATELET - Abnormal; Notable for the following:    WBC 16.4 (*)    RBC 3.72 (*)    Hemoglobin 10.0 (*)    HCT 32.4 (*)    RDW 16.2 (*)    Platelets 113 (*)    Neutrophils Relative % 87 (*)    Neutro Abs 14.2 (*)    Lymphocytes Relative 4 (*)    Monocytes Absolute 1.4 (*)    All other components within normal limits    Imaging Review Dg Chest Portable 1 View  12/05/2014   CLINICAL DATA:  Weakness, fatigue, and hypotension today; history of dialysis dependent renal failure, CHF, and previous tobacco use  EXAM: PORTABLE CHEST - 1 VIEW  COMPARISON:  Portable chest x-ray of August 09, 2012  FINDINGS: The left lung is well-expanded. There is no focal infiltrate.  There is no pleural effusion. On the right the interstitial markings remain mildly increased but have improved since the March 2014 study at which time pneumonia was present. The cardiac silhouette remains enlarged. There is an aortic stent graft in place. A permanent pacemaker is present. The patient has undergone previous median sternotomy.  IMPRESSION: There is no evidence of CHF. Mild prominence the pulmonary interstitial markings on the right is noted without evidence of discrete infiltrate. These interstitial markings remain increased above baseline from February 2014 but are improved since the study of March 2014.   Electronically Signed   By: David  [place] M.D.   On: 12/05/2014 16:49     EKG Interpretation   Date/Time:  Wednesday December 05 2014 16:20:06 EDT Ventricular Rate:  62 PR Interval:    QRS Duration: 219 QT Interval:  527 QTC Calculation: 535 R Axis:   -157 Text Interpretation:  rhythm indeterminate Paired ventricular premature  complexes RBBB and LPFB Abnormal ekg Confirmed by Bebe Shaggy  MD, Makyla Bye  [phone]) on 12/05/2014 4:29:59 PM      EKG Interpretation  Date/Time:  Wednesday December 05 2014 17:10:20 EDT Ventricular Rate:  86 PR Interval:  67 QRS  Duration: 174 QT Interval:  446 QTC Calculation: 533 R Axis:   -124 Text Interpretation:  Wandering atrial pacemaker Paired ventricular premature complexes LAE, consider biatrial enlargement Right bundle branch block Artifact in lead(s) I III aVL significant artifact noted Confirmed by Bebe Shaggy  MD, Dorinda Hill ([id number]) on 12/05/2014 5:16:51 PM       MDM   Final diagnoses:  Gangrene of foot  Other fatigue  Chronic renal failure, unspecified stage    Nursing notes including past medical history and social history reviewed and considered in documentation xrays/imaging reviewed by myself and considered during evaluation Labs/vital reviewed myself and considered during evaluation Previous records reviewed and considered     Zadie Rhine, MD 12/05/14 1908

## 2014-12-05 NOTE — ED Notes (Signed)
Pt is from home with altered mental status since having dialysis yesterday. Home health nurse stated SBP of 90 and was concerned of dehydration. EMS states pt has been alert and oriented since arrival on scene.

## 2014-12-06 ENCOUNTER — Emergency Department (HOSPITAL_COMMUNITY)
Admission: EM | Admit: 2014-12-06 | Discharge: 2014-12-07 | Disposition: E | Payer: Medicare Other | Attending: Emergency Medicine | Admitting: Emergency Medicine

## 2014-12-06 ENCOUNTER — Encounter (HOSPITAL_COMMUNITY): Payer: Self-pay

## 2014-12-06 DIAGNOSIS — E785 Hyperlipidemia, unspecified: Secondary | ICD-10-CM | POA: Insufficient documentation

## 2014-12-06 DIAGNOSIS — M109 Gout, unspecified: Secondary | ICD-10-CM | POA: Insufficient documentation

## 2014-12-06 DIAGNOSIS — Z951 Presence of aortocoronary bypass graft: Secondary | ICD-10-CM | POA: Insufficient documentation

## 2014-12-06 DIAGNOSIS — Z7982 Long term (current) use of aspirin: Secondary | ICD-10-CM | POA: Insufficient documentation

## 2014-12-06 DIAGNOSIS — N186 End stage renal disease: Secondary | ICD-10-CM | POA: Insufficient documentation

## 2014-12-06 DIAGNOSIS — I469 Cardiac arrest, cause unspecified: Secondary | ICD-10-CM | POA: Insufficient documentation

## 2014-12-06 DIAGNOSIS — Z87891 Personal history of nicotine dependence: Secondary | ICD-10-CM | POA: Insufficient documentation

## 2014-12-06 DIAGNOSIS — I252 Old myocardial infarction: Secondary | ICD-10-CM | POA: Insufficient documentation

## 2014-12-06 DIAGNOSIS — I2510 Atherosclerotic heart disease of native coronary artery without angina pectoris: Secondary | ICD-10-CM | POA: Insufficient documentation

## 2014-12-06 DIAGNOSIS — I120 Hypertensive chronic kidney disease with stage 5 chronic kidney disease or end stage renal disease: Secondary | ICD-10-CM | POA: Insufficient documentation

## 2014-12-06 DIAGNOSIS — Z8701 Personal history of pneumonia (recurrent): Secondary | ICD-10-CM | POA: Insufficient documentation

## 2014-12-06 DIAGNOSIS — Z79899 Other long term (current) drug therapy: Secondary | ICD-10-CM | POA: Insufficient documentation

## 2014-12-06 DIAGNOSIS — Z992 Dependence on renal dialysis: Secondary | ICD-10-CM | POA: Insufficient documentation

## 2014-12-06 MED ORDER — SODIUM BICARBONATE 8.4 % IV SOLN
INTRAVENOUS | Status: AC | PRN
  Administered 2014-12-06: 50 meq via INTRAVENOUS

## 2014-12-06 MED ORDER — CALCIUM CHLORIDE 10 % IV SOLN
INTRAVENOUS | Status: AC | PRN
  Administered 2014-12-06: 1 g via INTRAVENOUS

## 2014-12-07 MED FILL — Medication: Qty: 1 | Status: AC

## 2014-12-07 NOTE — Progress Notes (Signed)
Responded to CPR in progress.  Patient died shortly after arriving in ED.  Escorted family to consultation room for EDP briefing. Family at bedside. Provided prayer , grief and emotional support.  Remained with family until departure.

## 2014-12-07 NOTE — ED Notes (Signed)
[facility]: PT from home, called out by daughter for seizure like activity. On EMS arrival, pt was alert . At 1047 pt had witnessed cardiac arrest by medic. CPR initiated. Pt noted to be in V. Fib and shocked twice. ROSC returned at 1118 but pt arrested again at 1142. Noted to be in PEA. En route pt given 1 L NS, 4 epi's. CPR continued on arrival to ED. Hx: Dialysis

## 2014-12-07 NOTE — Progress Notes (Signed)
RT responded to CPR in Trauma C. Pt with king airway from EMS. Pt manually ventilated throughout entire code. Pt never regained pulse. CPR discontinued. Per Dr. Rubin PayorPickering, king airway to remain in place for now. Will extubate in not ME case later.

## 2014-12-07 NOTE — ED Provider Notes (Addendum)
CSN: [id number]     Arrival date & time 2014-12-08  1207 History   First MD Initiated Contact with Patient 12/08/14 1212     Chief Complaint  Patient presents with  . Cardiac Arrest     (Consider location/radiation/quality/duration/timing/severity/associated sxs/prior Treatment) The history is provided by the EMS personnel.   patient presents in cardiac arrest. Reportedly called the house for seizure. Then had V. fib arrest. Appendectomy given twice and had return of vitals about 15 minutes later lost again and as of 30 minutes prior to arrival had not had return of vitals. Is a dialysis patient was due for dialysis this morning. Reportedly [facility] yesterday for generalized weakness. Has known severe foot disease and per the note yesterday was likely going to be on hospice soon.  Past Medical History  Diagnosis Date  . Hyperlipidemia   . Hypertension   . Congestive heart failure, unspecified   . Coronary atherosclerosis of native coronary artery   . 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 End stage renal disease     on HD MWF; [facility]  . Aortoiliac occlusive disease   . Secondary hyperparathyroidism   . Gout   . MI (myocardial infarction)   . Hemodialysis patient 1999  . Shortness of breath   . Pneumonia 08/09/2012   Past Surgical History  Procedure Laterality Date  . Coronary artery bypass graft  04/18/05    Rachell Cipro  . Ppm  11/18/05    Medtronic EnRhythm P1501-complete heart block  . Dialysis fistula creation    . Pacemaker generator change  08/05/12    MDT Adaptal L generator placd by Dr Johney Frame  . Permanent pacemaker generator change N/A 08/05/2012    Procedure: PERMANENT PACEMAKER GENERATOR CHANGE;  Surgeon: Hillis Range, MD;  Location: [facility] CATH LAB;  Service: Cardiovascular;  Laterality: N/A;  . Lower extremity angiogram N/A 10/04/2014    Procedure: LOWER EXTREMITY ANGIOGRAM;  Surgeon: Fransisco Hertz, MD;  Location: [facility]  CATH LAB;  Service: Cardiovascular;  Laterality: N/A;   No family history on file. History  Substance Use Topics  . Smoking status: Former Smoker -- 1.00 packs/day for 50 years    Types: Cigarettes    Quit date: 06/09/1995  . Smokeless tobacco: Never Used  . Alcohol Use: No    Review of Systems  Unable to perform ROS     Allergies  Review of patient's allergies indicates no known allergies.  Home Medications   Prior to Admission medications   Medication Sig Start Date End Date Taking? Authorizing Provider  aspirin 81 MG tablet Take 81 mg by mouth daily.    Historical Provider, MD  atorvastatin (LIPITOR) 40 MG tablet Take 40 mg by mouth daily.    Historical Provider, MD  carvedilol (COREG) 6.25 MG tablet TAKE ONE TABLET BY MOUTH TWICE DAILY WITH FOOD 06/12/14   Ernestina Penna, MD  COLCRYS 0.6 MG tablet TAKE ONE TABLET BY MOUTH EVERY DAY AS DIRECTED 11/15/14   Tiffany A Gann, PA-C  cyclobenzaprine (FLEXERIL) 5 MG tablet Take 1 tablet (5 mg total) by mouth 3 (three) times daily as needed for muscle spasms. 10/02/14   Tiffany A Gann, PA-C  HYDROcodone-acetaminophen (NORCO/VICODIN) 5-325 MG per tablet Take 1 tablet by mouth daily as needed for moderate pain.  10/18/14   Historical Provider, MD  isosorbide mononitrate (IMDUR) 30 MG 24 hr tablet TAKE ONE TABLET BY MOUTH ONE TIME DAILY  07/06/14   Mary-Margaret Daphine DeutscherMartin, FNP  lidocaine-prilocaine (EMLA) cream Apply 1 application topically daily. On Tuesday, Thursday, and Saturday 11/10/14   Historical Provider, MD  multivitamin (RENA-VIT) TABS tablet Take 1 tablet by mouth daily.    Historical Provider, MD  pantoprazole (PROTONIX) 40 MG tablet TAKE ONE TABLET BY MOUTH ONE TIME DAILY 11/27/14   Tiffany A Gann, PA-C  sevelamer (RENAGEL) 800 MG tablet Take 1,600-3,200 mg by mouth See admin instructions. Take 4 tablets with meals and 2 tablets with snacks.    Historical Provider, MD   BP 0/0 mmHg  Pulse 0  Resp 20  Wt 156 lb (70.761 kg)  SpO2  100% Physical Exam  Constitutional: He appears well-developed.  HENT:  Head: Atraumatic.  Eyes:  Pupils around 3 mm and unreactive  Neck: Neck supple.  Cardiovascular:  No pulse  Pulmonary/Chest:  Equal breath [facility]  Abdominal: There is no tenderness.  Musculoskeletal:  Dialysis graft left upper extremity  Neurological:  No response to pain  Skin: Skin is warm.    ED Course  Procedures (including critical care time) Labs Review Labs Reviewed - No data to display  Imaging Review Dg Chest Portable 1 View  12/05/2014   CLINICAL DATA:  Weakness, fatigue, and hypotension today; history of dialysis dependent renal failure, CHF, and previous tobacco use  EXAM: PORTABLE CHEST - 1 VIEW  COMPARISON:  Portable chest x-ray of August 09, 2012  FINDINGS: The left lung is well-expanded. There is no focal infiltrate. There is no pleural effusion. On the right the interstitial markings remain mildly increased but have improved since the March 2014 study at which time pneumonia was present. The cardiac silhouette remains enlarged. There is an aortic stent graft in place. A permanent pacemaker is present. The patient has undergone previous median sternotomy.  IMPRESSION: There is no evidence of CHF. Mild prominence the pulmonary interstitial markings on the right is noted without evidence of discrete infiltrate. These interstitial markings remain increased above baseline from February 2014 but are improved since the study of March 2014.   Electronically Signed   By: David  SwazilandJordan M.D.   On: 12/05/2014 16:49     EKG Interpretation None      MDM   Final diagnoses:  Cardiac arrest    Patient presented in cardiac arrest. No return of vitals in the ER. Due to patient's dialysis status given calcium and bicarbonate ER. Was in PEA. No cardiac activity on bedside ultrasound time of death 1211, patient's PCP, Tiffany Chauncey ReadingGann was notified and will sign the death certificate  family was also notified.    Benjiman CoreNathan Carollyn Etcheverry, MD Aug 09, 2014 1250  Cardiopulmonary Resuscitation (CPR) Procedure Note Directed/Performed by: Billee CashingPICKERING,Hendry Speas R. I personally directed ancillary staff and/or performed CPR in an effort to regain return of spontaneous circulation and to maintain cardiac, neuro and systemic perfusion.    Benjiman CoreNathan Renezmae Canlas, MD Aug 09, 2014 1250

## 2014-12-07 NOTE — Code Documentation (Signed)
Patient time of death occurred at 1211. 

## 2014-12-07 NOTE — Code Documentation (Signed)
Organ procurement team notified.

## 2014-12-07 DEATH — deceased
